# Patient Record
Sex: Female | Born: 1995 | Race: White | Hispanic: No | Marital: Single | Smoking: Never smoker
Health system: Southern US, Community
[De-identification: ages and names within clinical notes are randomized; demographics above are authoritative.]

## PROBLEM LIST (undated history)

## (undated) ENCOUNTER — Inpatient Hospital Stay (HOSPITAL_COMMUNITY): Payer: Self-pay

## (undated) DIAGNOSIS — B999 Unspecified infectious disease: Secondary | ICD-10-CM

## (undated) DIAGNOSIS — Z789 Other specified health status: Secondary | ICD-10-CM

## (undated) HISTORY — PX: TONSILLECTOMY: SUR1361

## (undated) HISTORY — DX: Other specified health status: Z78.9

---
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
## 2013-09-08 ENCOUNTER — Other Ambulatory Visit: Payer: Self-pay | Admitting: Obstetrics and Gynecology

## 2013-09-08 DIAGNOSIS — O3680X0 Pregnancy with inconclusive fetal viability, not applicable or unspecified: Secondary | ICD-10-CM

## 2013-09-10 ENCOUNTER — Other Ambulatory Visit: Payer: Self-pay | Admitting: Obstetrics and Gynecology

## 2013-09-10 ENCOUNTER — Encounter: Payer: Self-pay | Admitting: Obstetrics and Gynecology

## 2013-09-10 ENCOUNTER — Ambulatory Visit (INDEPENDENT_AMBULATORY_CARE_PROVIDER_SITE_OTHER): Payer: Medicaid Other

## 2013-09-10 DIAGNOSIS — O26849 Uterine size-date discrepancy, unspecified trimester: Secondary | ICD-10-CM

## 2013-09-10 DIAGNOSIS — O3680X0 Pregnancy with inconclusive fetal viability, not applicable or unspecified: Secondary | ICD-10-CM

## 2013-09-10 NOTE — Progress Notes (Signed)
U/S-single IUP with +FCA noted, FHR-168 bpm, cx appears closed (3.1cm), Rt ovary with C.L. Simple cyst noted=3.7cm, no free fluid noted within the pelvis, Lt ovary appears WNL, CRL c/w 9+5 wks EDD 04/10/2014

## 2013-09-15 ENCOUNTER — Encounter: Payer: Self-pay | Admitting: Advanced Practice Midwife

## 2013-09-15 ENCOUNTER — Ambulatory Visit (INDEPENDENT_AMBULATORY_CARE_PROVIDER_SITE_OTHER): Payer: Medicaid Other | Admitting: Advanced Practice Midwife

## 2013-09-15 VITALS — BP 122/68 | Ht 64.0 in | Wt 125.0 lb

## 2013-09-15 DIAGNOSIS — Z0184 Encounter for antibody response examination: Secondary | ICD-10-CM

## 2013-09-15 DIAGNOSIS — Z331 Pregnant state, incidental: Secondary | ICD-10-CM

## 2013-09-15 DIAGNOSIS — Z0189 Encounter for other specified special examinations: Secondary | ICD-10-CM

## 2013-09-15 DIAGNOSIS — Z1371 Encounter for nonprocreative screening for genetic disease carrier status: Secondary | ICD-10-CM

## 2013-09-15 DIAGNOSIS — Z3400 Encounter for supervision of normal first pregnancy, unspecified trimester: Secondary | ICD-10-CM | POA: Insufficient documentation

## 2013-09-15 DIAGNOSIS — Z1389 Encounter for screening for other disorder: Secondary | ICD-10-CM

## 2013-09-15 DIAGNOSIS — Z3401 Encounter for supervision of normal first pregnancy, first trimester: Secondary | ICD-10-CM

## 2013-09-15 DIAGNOSIS — Z1159 Encounter for screening for other viral diseases: Secondary | ICD-10-CM

## 2013-09-15 LAB — CBC
HEMATOCRIT: 38.1 % (ref 36.0–46.0)
Hemoglobin: 13 g/dL (ref 12.0–15.0)
MCH: 29.3 pg (ref 26.0–34.0)
MCHC: 34.1 g/dL (ref 30.0–36.0)
MCV: 86 fL (ref 78.0–100.0)
PLATELETS: 240 10*3/uL (ref 150–400)
RBC: 4.43 MIL/uL (ref 3.87–5.11)
RDW: 12.9 % (ref 11.5–15.5)
WBC: 8.4 10*3/uL (ref 4.0–10.5)

## 2013-09-15 LAB — OB RESULTS CONSOLE GC/CHLAMYDIA
CHLAMYDIA, DNA PROBE: NEGATIVE
Gonorrhea: NEGATIVE

## 2013-09-15 MED ORDER — DOXYLAMINE-PYRIDOXINE 10-10 MG PO TBEC: DELAYED_RELEASE_TABLET | ORAL | Status: DC

## 2013-09-15 NOTE — Progress Notes (Signed)
  Subjective:    Debbie Liu is a G1P0 5973w3d being seen today for her first obstetrical visit.  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  Pregnancy history fully reviewed.  Patient reports nausea in the mornings.  Requests medication.  Filed Vitals:   09/15/13 1500 09/15/13 1501  BP: 122/68   Height:  5\' 4"  (1.626 m)  Weight: 125 lb (56.7 kg)     HISTORY: OB History  Gravida Para Term Preterm AB SAB TAB Ectopic Multiple Living  1             # Outcome Date GA Lbr Len/2nd Weight Sex Delivery Anes PTL Lv  1 CUR              Past Medical History  Diagnosis Date  . Medical history non-contributory    Past Surgical History  Procedure Laterality Date  . Tonsillectomy     Family History  Problem Relation Age of Onset  . Thyroid disease Mother   . Cancer Paternal Grandmother     breast  . Diabetes Paternal Grandfather      Exam                                      System:     Skin: normal coloration and turgor, no rashes    Neurologic: oriented, normal, normal mood   Extremities: normal strength, tone, and muscle mass   HEENT PERRLA   Mouth/Teeth mucous membranes moist, normal dentition   Neck supple and no masses   Cardiovascular: regular rate and rhythm   Respiratory:  appears well, vitals normal, no respiratory distress, acyanotic   Abdomen: soft, non-tender;  FHR: 160          Assessment:    Pregnancy: G1P0 Patient Active Problem List   Diagnosis Date Noted  . Encounter for supervision of normal first pregnancy in first trimester 09/15/2013        Plan:     Initial labs drawn. Continue prenatal vitamins  Problem list reviewed and updated  Reviewed n/v relief measures and warning s/s to report .  Diclegis asamples/coupon given Reviewed recommended weight gain based on pre-gravid BMI  Encouraged well-balanced diet Genetic Screening discussed Integrated Screen: requested.  Ultrasound discussed; fetal  survey: requested.  Follow up in 2 weeks for NT/IT/Low-risk ob appt .  CRESENZO-DISHMAN,Rachele Lamaster 09/15/2013

## 2013-09-15 NOTE — Patient Instructions (Signed)
First Trimester of Pregnancy The first trimester of pregnancy is from week 1 until the end of week 12 (months 1 through 3). A week after a sperm fertilizes an egg, the egg will implant on the wall of the uterus. This embryo will begin to develop into a baby. Genes from you and your partner are forming the baby. The female genes determine whether the baby is a boy or a girl. At 6-8 weeks, the eyes and face are formed, and the heartbeat can be seen on ultrasound. At the end of 12 weeks, all the baby's organs are formed.  Now that you are pregnant, you will want to do everything you can to have a healthy baby. Two of the most important things are to get good prenatal care and to follow your health care provider's instructions. Prenatal care is all the medical care you receive before the baby's birth. This care will help prevent, find, and treat any problems during the pregnancy and childbirth. BODY CHANGES Your body goes through many changes during pregnancy. The changes vary from woman to woman.   You may gain or lose a couple of pounds at first.  You may feel sick to your stomach (nauseous) and throw up (vomit). If the vomiting is uncontrollable, call your health care provider.  You may tire easily.  You may develop headaches that can be relieved by medicines approved by your health care provider.  You may urinate more often. Painful urination may mean you have a bladder infection.  You may develop heartburn as a result of your pregnancy.  You may develop constipation because certain hormones are causing the muscles that push waste through your intestines to slow down.  You may develop hemorrhoids or swollen, bulging veins (varicose veins).  Your breasts may begin to grow larger and become tender. Your nipples may stick out more, and the tissue that surrounds them (areola) may become darker.  Your gums may bleed and may be sensitive to brushing and flossing.  Dark spots or blotches (chloasma,  mask of pregnancy) may develop on your face. This will likely fade after the baby is born.  Your menstrual periods will stop.  You may have a loss of appetite.  You may develop cravings for certain kinds of food.  You may have changes in your emotions from day to day, such as being excited to be pregnant or being concerned that something may go wrong with the pregnancy and baby.  You may have more vivid and strange dreams.  You may have changes in your hair. These can include thickening of your hair, rapid growth, and changes in texture. Some women also have hair loss during or after pregnancy, or hair that feels dry or thin. Your hair will most likely return to normal after your baby is born. WHAT TO EXPECT AT YOUR PRENATAL VISITS During a routine prenatal visit:  You will be weighed to make sure you and the baby are growing normally.  Your blood pressure will be taken.  Your abdomen will be measured to track your baby's growth.  The fetal heartbeat will be listened to starting around week 10 or 12 of your pregnancy.  Test results from any previous visits will be discussed. Your health care provider may ask you:  How you are feeling.  If you are feeling the baby move.  If you have had any abnormal symptoms, such as leaking fluid, bleeding, severe headaches, or abdominal cramping.  If you have any questions. Other tests   that may be performed during your first trimester include:  Blood tests to find your blood type and to check for the presence of any previous infections. They will also be used to check for low iron levels (anemia) and Rh antibodies. Later in the pregnancy, blood tests for diabetes will be done along with other tests if problems develop.  Urine tests to check for infections, diabetes, or protein in the urine.  An ultrasound to confirm the proper growth and development of the baby.  An amniocentesis to check for possible genetic problems.  Fetal screens for  spina bifida and Down syndrome.  You may need other tests to make sure you and the baby are doing well. HOME CARE INSTRUCTIONS  Medicines  Follow your health care provider's instructions regarding medicine use. Specific medicines may be either safe or unsafe to take during pregnancy.  Take your prenatal vitamins as directed.  If you develop constipation, try taking a stool softener if your health care provider approves. Diet  Eat regular, well-balanced meals. Choose a variety of foods, such as meat or vegetable-based protein, fish, milk and low-fat dairy products, vegetables, fruits, and whole grain breads and cereals. Your health care provider will help you determine the amount of weight gain that is right for you.  Avoid raw meat and uncooked cheese. These carry germs that can cause birth defects in the baby.  Eating four or five small meals rather than three large meals a day may help relieve nausea and vomiting. If you start to feel nauseous, eating a few soda crackers can be helpful. Drinking liquids between meals instead of during meals also seems to help nausea and vomiting.  If you develop constipation, eat more high-fiber foods, such as fresh vegetables or fruit and whole grains. Drink enough fluids to keep your urine clear or pale yellow. Activity and Exercise  Exercise only as directed by your health care provider. Exercising will help you:  Control your weight.  Stay in shape.  Be prepared for labor and delivery.  Experiencing pain or cramping in the lower abdomen or low back is a good sign that you should stop exercising. Check with your health care provider before continuing normal exercises.  Try to avoid standing for long periods of time. Move your legs often if you must stand in one place for a long time.  Avoid heavy lifting.  Wear low-heeled shoes, and practice good posture.  You may continue to have sex unless your health care provider directs you  otherwise. Relief of Pain or Discomfort  Wear a good support bra for breast tenderness.   Take warm sitz baths to soothe any pain or discomfort caused by hemorrhoids. Use hemorrhoid cream if your health care provider approves.   Rest with your legs elevated if you have leg cramps or low back pain.  If you develop varicose veins in your legs, wear support hose. Elevate your feet for 15 minutes, 3-4 times a day. Limit salt in your diet. Prenatal Care  Schedule your prenatal visits by the twelfth week of pregnancy. They are usually scheduled monthly at first, then more often in the last 2 months before delivery.  Write down your questions. Take them to your prenatal visits.  Keep all your prenatal visits as directed by your health care provider. Safety  Wear your seat belt at all times when driving.  Make a list of emergency phone numbers, including numbers for family, friends, the hospital, and police and fire departments. General Tips    Ask your health care provider for a referral to a local prenatal education class. Begin classes no later than at the beginning of month 6 of your pregnancy.  Ask for help if you have counseling or nutritional needs during pregnancy. Your health care provider can offer advice or refer you to specialists for help with various needs.  Do not use hot tubs, steam rooms, or saunas.  Do not douche or use tampons or scented sanitary pads.  Do not cross your legs for long periods of time.  Avoid cat litter boxes and soil used by cats. These carry germs that can cause birth defects in the baby and possibly loss of the fetus by miscarriage or stillbirth.  Avoid all smoking, herbs, alcohol, and medicines not prescribed by your health care provider. Chemicals in these affect the formation and growth of the baby.  Schedule a dentist appointment. At home, brush your teeth with a soft toothbrush and be gentle when you floss. SEEK MEDICAL CARE IF:   You have  dizziness.  You have mild pelvic cramps, pelvic pressure, or nagging pain in the abdominal area.  You have persistent nausea, vomiting, or diarrhea.  You have a bad smelling vaginal discharge.  You have pain with urination.  You notice increased swelling in your face, hands, legs, or ankles. SEEK IMMEDIATE MEDICAL CARE IF:   You have a fever.  You are leaking fluid from your vagina.  You have spotting or bleeding from your vagina.  You have severe abdominal cramping or pain.  You have rapid weight gain or loss.  You vomit blood or material that looks like coffee grounds.  You are exposed to German measles and have never had them.  You are exposed to fifth disease or chickenpox.  You develop a severe headache.  You have shortness of breath.  You have any kind of trauma, such as from a fall or a car accident. Document Released: 01/23/2001 Document Revised: 06/15/2013 Document Reviewed: 12/09/2012 Exit[employer] Patient Information 2015 [employer]. This information is not intended to replace advice given to you by your health care provider. Make sure you discuss any questions you have with your health care provider.  

## 2013-09-16 LAB — URINALYSIS, ROUTINE W REFLEX MICROSCOPIC
Bilirubin Urine: NEGATIVE
Glucose, UA: NEGATIVE mg/dL
Hgb urine dipstick: NEGATIVE
Ketones, ur: NEGATIVE mg/dL
Leukocytes, UA: NEGATIVE
NITRITE: NEGATIVE
Protein, ur: NEGATIVE mg/dL
SPECIFIC GRAVITY, URINE: 1.029 (ref 1.005–1.030)
UROBILINOGEN UA: 0.2 mg/dL (ref 0.0–1.0)
pH: 6 (ref 5.0–8.0)

## 2013-09-16 LAB — DRUG SCREEN, URINE, NO CONFIRMATION
Amphetamine Screen, Ur: NEGATIVE
BENZODIAZEPINES.: NEGATIVE
Barbiturate Quant, Ur: NEGATIVE
Cocaine Metabolites: NEGATIVE
Creatinine,U: 167.3 mg/dL
MARIJUANA METABOLITE: NEGATIVE
Methadone: NEGATIVE
Opiate Screen, Urine: NEGATIVE
Phencyclidine (PCP): NEGATIVE
Propoxyphene: NEGATIVE

## 2013-09-16 LAB — ANTIBODY SCREEN: ANTIBODY SCREEN: NEGATIVE

## 2013-09-16 LAB — HIV ANTIBODY (ROUTINE TESTING W REFLEX): HIV 1&2 Ab, 4th Generation: NONREACTIVE

## 2013-09-16 LAB — GC/CHLAMYDIA PROBE AMP
CT Probe RNA: NEGATIVE
GC Probe RNA: NEGATIVE

## 2013-09-16 LAB — VARICELLA ZOSTER ANTIBODY, IGG: VARICELLA IGG: 87.14 {index} (ref <135.00)

## 2013-09-16 LAB — OXYCODONE SCREEN, UA, RFLX CONFIRM: OXYCODONE SCRN UR: NEGATIVE ng/mL

## 2013-09-16 LAB — HEPATITIS B SURFACE ANTIGEN: HEP B S AG: NEGATIVE

## 2013-09-16 LAB — RPR

## 2013-09-16 LAB — RUBELLA SCREEN: RUBELLA: 1 {index} — AB (ref <0.90)

## 2013-09-16 LAB — ABO AND RH: Rh Type: POSITIVE

## 2013-09-17 LAB — URINE CULTURE
Colony Count: NO GROWTH
Organism ID, Bacteria: NO GROWTH

## 2013-09-18 LAB — CYSTIC FIBROSIS DIAGNOSTIC STUDY

## 2013-09-23 ENCOUNTER — Ambulatory Visit (INDEPENDENT_AMBULATORY_CARE_PROVIDER_SITE_OTHER): Payer: Medicaid Other

## 2013-09-23 ENCOUNTER — Other Ambulatory Visit: Payer: Self-pay | Admitting: Advanced Practice Midwife

## 2013-09-23 ENCOUNTER — Encounter: Payer: Self-pay | Admitting: Women's Health

## 2013-09-23 ENCOUNTER — Ambulatory Visit (INDEPENDENT_AMBULATORY_CARE_PROVIDER_SITE_OTHER): Payer: Medicaid Other | Admitting: Women's Health

## 2013-09-23 VITALS — BP 102/70 | Wt 127.0 lb

## 2013-09-23 DIAGNOSIS — Z1389 Encounter for screening for other disorder: Secondary | ICD-10-CM

## 2013-09-23 DIAGNOSIS — Z3401 Encounter for supervision of normal first pregnancy, first trimester: Secondary | ICD-10-CM

## 2013-09-23 DIAGNOSIS — Z283 Underimmunization status: Secondary | ICD-10-CM

## 2013-09-23 DIAGNOSIS — Z36 Encounter for antenatal screening of mother: Secondary | ICD-10-CM

## 2013-09-23 DIAGNOSIS — Z2839 Other underimmunization status: Secondary | ICD-10-CM | POA: Insufficient documentation

## 2013-09-23 DIAGNOSIS — Z331 Pregnant state, incidental: Secondary | ICD-10-CM

## 2013-09-23 DIAGNOSIS — Z3400 Encounter for supervision of normal first pregnancy, unspecified trimester: Secondary | ICD-10-CM

## 2013-09-23 DIAGNOSIS — O09899 Supervision of other high risk pregnancies, unspecified trimester: Secondary | ICD-10-CM | POA: Insufficient documentation

## 2013-09-23 LAB — POCT URINALYSIS DIPSTICK
Blood, UA: NEGATIVE
GLUCOSE UA: NEGATIVE
Ketones, UA: NEGATIVE
NITRITE UA: NEGATIVE

## 2013-09-23 NOTE — Progress Notes (Signed)
U/S(11+4wks)-single active fetus, anterior Gr 0 placenta,cx appears closed (3.4cm), bilateral adnexa appears WNL with C.L. Noted on Rt=2.9cm simple, FHR- 162 bpm, CRL c/w dates,NB present,NT-1.816mm

## 2013-09-23 NOTE — Patient Instructions (Signed)
First Trimester of Pregnancy The first trimester of pregnancy is from week 1 until the end of week 12 (months 1 through 3). A week after a sperm fertilizes an egg, the egg will implant on the wall of the uterus. This embryo will begin to develop into a baby. Genes from you and your partner are forming the baby. The female genes determine whether the baby is a boy or a girl. At 6-8 weeks, the eyes and face are formed, and the heartbeat can be seen on ultrasound. At the end of 12 weeks, all the baby's organs are formed.  Now that you are pregnant, you will want to do everything you can to have a healthy baby. Two of the most important things are to get good prenatal care and to follow your health care provider's instructions. Prenatal care is all the medical care you receive before the baby's birth. This care will help prevent, find, and treat any problems during the pregnancy and childbirth. BODY CHANGES Your body goes through many changes during pregnancy. The changes vary from woman to woman.   You may gain or lose a couple of pounds at first.  You may feel sick to your stomach (nauseous) and throw up (vomit). If the vomiting is uncontrollable, call your health care provider.  You may tire easily.  You may develop headaches that can be relieved by medicines approved by your health care provider.  You may urinate more often. Painful urination may mean you have a bladder infection.  You may develop heartburn as a result of your pregnancy.  You may develop constipation because certain hormones are causing the muscles that push waste through your intestines to slow down.  You may develop hemorrhoids or swollen, bulging veins (varicose veins).  Your breasts may begin to grow larger and become tender. Your nipples may stick out more, and the tissue that surrounds them (areola) may become darker.  Your gums may bleed and may be sensitive to brushing and flossing.  Dark spots or blotches (chloasma,  mask of pregnancy) may develop on your face. This will likely fade after the baby is born.  Your menstrual periods will stop.  You may have a loss of appetite.  You may develop cravings for certain kinds of food.  You may have changes in your emotions from day to day, such as being excited to be pregnant or being concerned that something may go wrong with the pregnancy and baby.  You may have more vivid and strange dreams.  You may have changes in your hair. These can include thickening of your hair, rapid growth, and changes in texture. Some women also have hair loss during or after pregnancy, or hair that feels dry or thin. Your hair will most likely return to normal after your baby is born. WHAT TO EXPECT AT YOUR PRENATAL VISITS During a routine prenatal visit:  You will be weighed to make sure you and the baby are growing normally.  Your blood pressure will be taken.  Your abdomen will be measured to track your baby's growth.  The fetal heartbeat will be listened to starting around week 10 or 12 of your pregnancy.  Test results from any previous visits will be discussed. Your health care provider may ask you:  How you are feeling.  If you are feeling the baby move.  If you have had any abnormal symptoms, such as leaking fluid, bleeding, severe headaches, or abdominal cramping.  If you have any questions. Other tests   that may be performed during your first trimester include:  Blood tests to find your blood type and to check for the presence of any previous infections. They will also be used to check for low iron levels (anemia) and Rh antibodies. Later in the pregnancy, blood tests for diabetes will be done along with other tests if problems develop.  Urine tests to check for infections, diabetes, or protein in the urine.  An ultrasound to confirm the proper growth and development of the baby.  An amniocentesis to check for possible genetic problems.  Fetal screens for  spina bifida and Down syndrome.  You may need other tests to make sure you and the baby are doing well. HOME CARE INSTRUCTIONS  Medicines  Follow your health care provider's instructions regarding medicine use. Specific medicines may be either safe or unsafe to take during pregnancy.  Take your prenatal vitamins as directed.  If you develop constipation, try taking a stool softener if your health care provider approves. Diet  Eat regular, well-balanced meals. Choose a variety of foods, such as meat or vegetable-based protein, fish, milk and low-fat dairy products, vegetables, fruits, and whole grain breads and cereals. Your health care provider will help you determine the amount of weight gain that is right for you.  Avoid raw meat and uncooked cheese. These carry germs that can cause birth defects in the baby.  Eating four or five small meals rather than three large meals a day may help relieve nausea and vomiting. If you start to feel nauseous, eating a few soda crackers can be helpful. Drinking liquids between meals instead of during meals also seems to help nausea and vomiting.  If you develop constipation, eat more high-fiber foods, such as fresh vegetables or fruit and whole grains. Drink enough fluids to keep your urine clear or pale yellow. Activity and Exercise  Exercise only as directed by your health care provider. Exercising will help you:  Control your weight.  Stay in shape.  Be prepared for labor and delivery.  Experiencing pain or cramping in the lower abdomen or low back is a good sign that you should stop exercising. Check with your health care provider before continuing normal exercises.  Try to avoid standing for long periods of time. Move your legs often if you must stand in one place for a long time.  Avoid heavy lifting.  Wear low-heeled shoes, and practice good posture.  You may continue to have sex unless your health care provider directs you  otherwise. Relief of Pain or Discomfort  Wear a good support bra for breast tenderness.   Take warm sitz baths to soothe any pain or discomfort caused by hemorrhoids. Use hemorrhoid cream if your health care provider approves.   Rest with your legs elevated if you have leg cramps or low back pain.  If you develop varicose veins in your legs, wear support hose. Elevate your feet for 15 minutes, 3-4 times a day. Limit salt in your diet. Prenatal Care  Schedule your prenatal visits by the twelfth week of pregnancy. They are usually scheduled monthly at first, then more often in the last 2 months before delivery.  Write down your questions. Take them to your prenatal visits.  Keep all your prenatal visits as directed by your health care provider. Safety  Wear your seat belt at all times when driving.  Make a list of emergency phone numbers, including numbers for family, friends, the hospital, and police and fire departments. General Tips    Ask your health care provider for a referral to a local prenatal education class. Begin classes no later than at the beginning of month 6 of your pregnancy.  Ask for help if you have counseling or nutritional needs during pregnancy. Your health care provider can offer advice or refer you to specialists for help with various needs.  Do not use hot tubs, steam rooms, or saunas.  Do not douche or use tampons or scented sanitary pads.  Do not cross your legs for long periods of time.  Avoid cat litter boxes and soil used by cats. These carry germs that can cause birth defects in the baby and possibly loss of the fetus by miscarriage or stillbirth.  Avoid all smoking, herbs, alcohol, and medicines not prescribed by your health care provider. Chemicals in these affect the formation and growth of the baby.  Schedule a dentist appointment. At home, brush your teeth with a soft toothbrush and be gentle when you floss. SEEK MEDICAL CARE IF:   You have  dizziness.  You have mild pelvic cramps, pelvic pressure, or nagging pain in the abdominal area.  You have persistent nausea, vomiting, or diarrhea.  You have a bad smelling vaginal discharge.  You have pain with urination.  You notice increased swelling in your face, hands, legs, or ankles. SEEK IMMEDIATE MEDICAL CARE IF:   You have a fever.  You are leaking fluid from your vagina.  You have spotting or bleeding from your vagina.  You have severe abdominal cramping or pain.  You have rapid weight gain or loss.  You vomit blood or material that looks like coffee grounds.  You are exposed to German measles and have never had them.  You are exposed to fifth disease or chickenpox.  You develop a severe headache.  You have shortness of breath.  You have any kind of trauma, such as from a fall or a car accident. Document Released: 01/23/2001 Document Revised: 06/15/2013 Document Reviewed: 12/09/2012 Exit[employer] Patient Information 2015 [employer]. This information is not intended to replace advice given to you by your health care provider. Make sure you discuss any questions you have with your health care provider.  

## 2013-09-23 NOTE — Progress Notes (Signed)
Low-risk OB appointment G1P0 5375w4d Estimated Date of Delivery: 04/10/14 BP 102/70  Wt 127 lb (57.607 kg)  LMP 07/20/2013  BP, weight, and urine reviewed.  Refer to obstetrical flow sheet for FH & FHR.  Denies cramping, lof, vb, or uti s/s. No complaints. Reviewed today's normal NT u/s, warning s/s to report. Plan:  Continue routine obstetrical care  F/U in 4wks for OB appointment and 2nd IT 1st nt/it today

## 2013-09-28 LAB — MATERNAL SCREEN, INTEGRATED #1

## 2013-10-21 ENCOUNTER — Ambulatory Visit (INDEPENDENT_AMBULATORY_CARE_PROVIDER_SITE_OTHER): Payer: Medicaid Other | Admitting: Women's Health

## 2013-10-21 VITALS — BP 108/60 | Wt 130.0 lb

## 2013-10-21 DIAGNOSIS — Z331 Pregnant state, incidental: Secondary | ICD-10-CM

## 2013-10-21 DIAGNOSIS — Z3401 Encounter for supervision of normal first pregnancy, first trimester: Secondary | ICD-10-CM

## 2013-10-21 DIAGNOSIS — Z1389 Encounter for screening for other disorder: Secondary | ICD-10-CM

## 2013-10-21 DIAGNOSIS — Z3400 Encounter for supervision of normal first pregnancy, unspecified trimester: Secondary | ICD-10-CM

## 2013-10-21 DIAGNOSIS — Z36 Encounter for antenatal screening of mother: Secondary | ICD-10-CM

## 2013-10-21 LAB — POCT URINALYSIS DIPSTICK
Blood, UA: NEGATIVE
Glucose, UA: NEGATIVE
Ketones, UA: NEGATIVE
LEUKOCYTES UA: NEGATIVE
Nitrite, UA: NEGATIVE
PROTEIN UA: NEGATIVE

## 2013-10-21 NOTE — Progress Notes (Signed)
Low-risk OB appointment G1P0 [redacted]w[redacted]d Estimated Date of Delivery: 04/10/14 LMP 07/20/2013  BP, weight, and urine reviewed.  Refer to obstetrical flow sheet for FH & FHR.  No fm yet. Denies cramping, lof, vb, or uti s/s. No complaints. Interested in Systems developer, info given Reviewed warning s/s to report. Plan:  Continue routine obstetrical care  F/U in 4wks for OB appointment and anatomy u/s 2nd IT today

## 2013-10-21 NOTE — Patient Instructions (Signed)
Second Trimester of Pregnancy The second trimester is from week 13 through week 28, months 4 through 6. The second trimester is often a time when you feel your best. Your body has also adjusted to being pregnant, and you begin to feel better physically. Usually, morning sickness has lessened or quit completely, you may have more energy, and you may have an increase in appetite. The second trimester is also a time when the fetus is growing rapidly. At the end of the sixth month, the fetus is about 9 inches long and weighs about 1 pounds. You will likely begin to feel the baby move (quickening) between 18 and 20 weeks of the pregnancy. BODY CHANGES Your body goes through many changes during pregnancy. The changes vary from woman to woman.   Your weight will continue to increase. You will notice your lower abdomen bulging out.  You may begin to get stretch marks on your hips, abdomen, and breasts.  You may develop headaches that can be relieved by medicines approved by your health care provider.  You may urinate more often because the fetus is pressing on your bladder.  You may develop or continue to have heartburn as a result of your pregnancy.  You may develop constipation because certain hormones are causing the muscles that push waste through your intestines to slow down.  You may develop hemorrhoids or swollen, bulging veins (varicose veins).  You may have back pain because of the weight gain and pregnancy hormones relaxing your joints between the bones in your pelvis and as a result of a shift in weight and the muscles that support your balance.  Your breasts will continue to grow and be tender.  Your gums may bleed and may be sensitive to brushing and flossing.  Dark spots or blotches (chloasma, mask of pregnancy) may develop on your face. This will likely fade after the baby is born.  A dark line from your belly button to the pubic area (linea nigra) may appear. This will likely fade  after the baby is born.  You may have changes in your hair. These can include thickening of your hair, rapid growth, and changes in texture. Some women also have hair loss during or after pregnancy, or hair that feels dry or thin. Your hair will most likely return to normal after your baby is born. WHAT TO EXPECT AT YOUR PRENATAL VISITS During a routine prenatal visit:  You will be weighed to make sure you and the fetus are growing normally.  Your blood pressure will be taken.  Your abdomen will be measured to track your baby's growth.  The fetal heartbeat will be listened to.  Any test results from the previous visit will be discussed. Your health care provider may ask you:  How you are feeling.  If you are feeling the baby move.  If you have had any abnormal symptoms, such as leaking fluid, bleeding, severe headaches, or abdominal cramping.  If you have any questions. Other tests that may be performed during your second trimester include:  Blood tests that check for:  Low iron levels (anemia).  Gestational diabetes (between 24 and 28 weeks).  Rh antibodies.  Urine tests to check for infections, diabetes, or protein in the urine.  An ultrasound to confirm the proper growth and development of the baby.  An amniocentesis to check for possible genetic problems.  Fetal screens for spina bifida and Down syndrome. HOME CARE INSTRUCTIONS   Avoid all smoking, herbs, alcohol, and unprescribed   drugs. These chemicals affect the formation and growth of the baby.  Follow your health care provider's instructions regarding medicine use. There are medicines that are either safe or unsafe to take during pregnancy.  Exercise only as directed by your health care provider. Experiencing uterine cramps is a good sign to stop exercising.  Continue to eat regular, healthy meals.  Wear a good support bra for breast tenderness.  Do not use hot tubs, steam rooms, or saunas.  Wear your  seat belt at all times when driving.  Avoid raw meat, uncooked cheese, cat litter boxes, and soil used by cats. These carry germs that can cause birth defects in the baby.  Take your prenatal vitamins.  Try taking a stool softener (if your health care provider approves) if you develop constipation. Eat more high-fiber foods, such as fresh vegetables or fruit and whole grains. Drink plenty of fluids to keep your urine clear or pale yellow.  Take warm sitz baths to soothe any pain or discomfort caused by hemorrhoids. Use hemorrhoid cream if your health care provider approves.  If you develop varicose veins, wear support hose. Elevate your feet for 15 minutes, 3-4 times a day. Limit salt in your diet.  Avoid heavy lifting, wear low heel shoes, and practice good posture.  Rest with your legs elevated if you have leg cramps or low back pain.  Visit your dentist if you have not gone yet during your pregnancy. Use a soft toothbrush to brush your teeth and be gentle when you floss.  A sexual relationship may be continued unless your health care provider directs you otherwise.  Continue to go to all your prenatal visits as directed by your health care provider. SEEK MEDICAL CARE IF:   You have dizziness.  You have mild pelvic cramps, pelvic pressure, or nagging pain in the abdominal area.  You have persistent nausea, vomiting, or diarrhea.  You have a bad smelling vaginal discharge.  You have pain with urination. SEEK IMMEDIATE MEDICAL CARE IF:   You have a fever.  You are leaking fluid from your vagina.  You have spotting or bleeding from your vagina.  You have severe abdominal cramping or pain.  You have rapid weight gain or loss.  You have shortness of breath with chest pain.  You notice sudden or extreme swelling of your face, hands, ankles, feet, or legs.  You have not felt your baby move in over an hour.  You have severe headaches that do not go away with  medicine.  You have vision changes. Document Released: 01/23/2001 Document Revised: 02/03/2013 Document Reviewed: 04/01/2012 ExitCare Patient Information 2015 [employer]. This information is not intended to replace advice given to you by your health care provider. Make sure you discuss any questions you have with your health care provider.  

## 2013-10-21 NOTE — Addendum Note (Signed)
Addended by: Colen Darling on: 10/21/2013 04:48 PM   Modules accepted: Orders

## 2013-10-24 LAB — MATERNAL SCREEN, INTEGRATED #2
AFP MoM: 1.24
AFP, Serum: 39.6 ng/mL
Age risk Down Syndrome: 1:1100 {titer}
Calculated Gestational Age: 15.1
Crown Rump Length: 46.1 mm
Estriol Mom: 1.19
Estriol, Free: 0.81 ng/mL
Inhibin A Dimeric: 229 pg/mL
Inhibin A MoM: 1.18
MSS Down Syndrome: <1:5000 {titer}
MSS Trisomy 18 Risk: <1:5000 {titer}
NT MoM: 1.36
Nuchal Translucency: 1.6 mm
Number of fetuses: 1
PAPP-A MoM: 1.1
PAPP-A: 571 ng/mL
Rish for ONTD: 1:3600 {titer}
hCG MoM: 1.11
hCG, Serum: 52.2 [IU]/mL

## 2013-10-26 ENCOUNTER — Encounter: Payer: Self-pay | Admitting: Women's Health

## 2013-11-06 ENCOUNTER — Encounter (HOSPITAL_COMMUNITY): Payer: Self-pay | Admitting: *Deleted

## 2013-11-06 ENCOUNTER — Inpatient Hospital Stay (HOSPITAL_COMMUNITY)
Admission: AD | Admit: 2013-11-06 | Discharge: 2013-11-06 | Disposition: A | Discharge status: Home / Self Care | Payer: Medicaid Other | Source: Ambulatory Visit | Attending: Obstetrics & Gynecology | Admitting: Obstetrics & Gynecology

## 2013-11-06 DIAGNOSIS — R21 Rash and other nonspecific skin eruption: Secondary | ICD-10-CM | POA: Diagnosis present

## 2013-11-06 DIAGNOSIS — O99891 Other specified diseases and conditions complicating pregnancy: Secondary | ICD-10-CM | POA: Diagnosis not present

## 2013-11-06 DIAGNOSIS — L508 Other urticaria: Secondary | ICD-10-CM | POA: Insufficient documentation

## 2013-11-06 DIAGNOSIS — Z3492 Encounter for supervision of normal pregnancy, unspecified, second trimester: Secondary | ICD-10-CM

## 2013-11-06 DIAGNOSIS — O9989 Other specified diseases and conditions complicating pregnancy, childbirth and the puerperium: Principal | ICD-10-CM

## 2013-11-06 DIAGNOSIS — L509 Urticaria, unspecified: Secondary | ICD-10-CM

## 2013-11-06 MED ORDER — METHYLPREDNISOLONE (PAK) 4 MG PO TABS: ORAL_TABLET | ORAL | Status: DC

## 2013-11-06 NOTE — MAU Note (Addendum)
Rash allover body, itches, when scratches it turns into whelps. Calamine calms it down. No oozing or drainage.  Hands and feet are swelling.  Significant other had it first, but not as bad

## 2013-11-06 NOTE — MAU Provider Note (Signed)
  History     CSN: [id number]  Arrival date and time: 11/06/13 4098   First Provider Initiated Contact with Patient 11/06/13 1858      Chief Complaint  Patient presents with  . Rash   HPI Debbie Liu is 18 y.o. G1P0 [redacted]w[redacted]d weeks presenting with a rash that began yesterday. She is a patient of [facility].  Woke up yesterday am with swollen eyes when she woke up, had resolved by 9am.  Saw "bug bites" yesterday afternoon.  She reports they are very itchy and when she scratches it swells.  Rash is red.  Neg for draining lesions.  Boyfriend had same rash and sxs 5 days ago.  His daughter is also in the house and doesn't have rash.  They washed all linen.  Denies fever, chills, URI sxs   She denies changing detergents, wearing clothes not washed first, no new foods or medicine, exposure to poison ivy,oak or sumac. Has used calamine without relief.    Past Medical History  Diagnosis Date  . Medical history non-contributory     Past Surgical History  Procedure Laterality Date  . Tonsillectomy      Family History  Problem Relation Age of Onset  . Thyroid disease Mother   . Cancer Paternal Grandmother     breast  . Diabetes Paternal Grandfather     History  Substance Use Topics  . Smoking status: Never Smoker   . Smokeless tobacco: Never Used  . Alcohol Use: No     Comment: proir to pregnancy    Allergies: No Known Allergies  Prescriptions prior to admission  Medication Sig Dispense Refill  . Prenatal Vit-Fe Fumarate-FA (PRENATAL MULTIVITAMIN) TABS tablet Take 1 tablet by mouth daily at 12 noon.        Review of Systems  Gastrointestinal: Negative for nausea, vomiting and abdominal pain.  Genitourinary: Negative.        Neg for vaginal bleeding  Skin: Positive for itching (intense) and rash (diffuse).   Physical Exam   Blood pressure 120/71, pulse 78, temperature 98.7 F (37.1 C), temperature source Oral, resp. rate 16, last menstrual period 07/20/2013.  Physical Exam   Constitutional: She is oriented to person, place, and time. She appears well-developed and well-nourished.  NAD  Cardiovascular: Normal rate.   Respiratory: No respiratory distress.  Neurological: She is alert and oriented to person, place, and time.  Skin: Rash (diffuse red, raised rash from chin to feet.  Neg for bullous lesions, excoriation.  It is erythematous and raised. ) noted.  Psychiatric: She has a normal mood and affect. Her behavior is normal.   MAU Course  Procedures  MDM Dr. Emelda Fear in to see patient with me.  Order given for Medrol Dose pack or Prednisone  X 5 days, which ever one is available.  Benadryl prn  Assessment and Plan  A:  Urticarial rash      [redacted]w[redacted]d gestation  P:  Rx to pharmacy-begin to night      Call Dr. Emelda Fear for worsening sxs     Keep scheduled appointment        Jhordyn Hoopingarner,EVE M 11/06/2013, 6:58 PM

## 2013-11-06 NOTE — Discharge Instructions (Signed)
Hives  Hives are itchy, red, puffy (swollen) areas of the skin. Hives can change in size and location on your body. Hives can come and go for hours, days, or weeks. Hives do not spread from person to person (noncontagious). Scratching, exercise, and stress can make your hives worse.  HOME CARE  · Avoid things that cause your hives (triggers).  · Take antihistamine medicines as told by your doctor. Do not drive while taking an antihistamine.  · Take any other medicines for itching as told by your doctor.  · Wear loose-fitting clothing.  · Keep all doctor visits as told.  GET HELP RIGHT AWAY IF:   · You have a fever.  · Your tongue or lips are puffy.  · You have trouble breathing or swallowing.  · You feel tightness in the throat or chest.  · You have belly (abdominal) pain.  · You have lasting or severe itching that is not helped by medicine.  · You have painful or puffy joints.  These problems may be the first sign of a life-threatening allergic reaction. Call your local emergency services (911 in [place]  MAKE SURE YOU:   · Understand these instructions.  · Will watch your condition.  · Will get help right away if you are not doing well or get worse.  Document Released: 11/08/2007 Document Revised: 07/31/2011 Document Reviewed: 04/24/2011  ExitCare® Patient Information ©2015 [employer]. This information is not intended to replace advice given to you by your health care provider. Make sure you discuss any questions you have with your health care provider.

## 2013-11-08 NOTE — MAU Provider Note (Signed)
Attestation of Attending Supervision of Advanced Practitioner: Evaluation and management procedures were performed by the PA/NP/CNM/OB Fellow under my supervision/collaboration. Chart reviewed and agree with management and plan. Pt seen with provider  Tilda Burrow 11/08/2013 4:44 PM

## 2013-11-09 ENCOUNTER — Telehealth: Payer: Self-pay | Admitting: Women's Health

## 2013-11-09 NOTE — Telephone Encounter (Signed)
Informed pt of Kim's instructions and pt verbalized understanding.

## 2013-11-09 NOTE — Telephone Encounter (Signed)
Pt states she has been getting hives off and on, it goes away with Benadryl but comes back when Benadryl wears off.  She went to [facility] on Friday and they put her on prednisone, she started it Saturday morning and states it is not helping at all, she is still having to use Benadryl.  Pt states that someone that works at [facility] told her it may be PUPP , she wants to know if she should continue the medication the hospital gave her or come in here to be seen.  Please advise.

## 2013-11-18 ENCOUNTER — Other Ambulatory Visit: Payer: Self-pay

## 2013-11-18 ENCOUNTER — Encounter: Payer: Self-pay | Admitting: Advanced Practice Midwife

## 2013-11-18 ENCOUNTER — Ambulatory Visit: Payer: Self-pay

## 2013-11-25 ENCOUNTER — Ambulatory Visit (INDEPENDENT_AMBULATORY_CARE_PROVIDER_SITE_OTHER): Payer: Medicaid Other

## 2013-11-25 ENCOUNTER — Ambulatory Visit (INDEPENDENT_AMBULATORY_CARE_PROVIDER_SITE_OTHER): Payer: Medicaid Other | Admitting: Women's Health

## 2013-11-25 ENCOUNTER — Other Ambulatory Visit: Payer: Self-pay | Admitting: Women's Health

## 2013-11-25 ENCOUNTER — Other Ambulatory Visit: Payer: Self-pay

## 2013-11-25 VITALS — BP 100/64 | Wt 137.0 lb

## 2013-11-25 DIAGNOSIS — Z3401 Encounter for supervision of normal first pregnancy, first trimester: Secondary | ICD-10-CM

## 2013-11-25 DIAGNOSIS — Z363 Encounter for antenatal screening for malformations: Secondary | ICD-10-CM

## 2013-11-25 DIAGNOSIS — Z331 Pregnant state, incidental: Secondary | ICD-10-CM

## 2013-11-25 DIAGNOSIS — Z36 Encounter for antenatal screening of mother: Secondary | ICD-10-CM

## 2013-11-25 DIAGNOSIS — Z1389 Encounter for screening for other disorder: Secondary | ICD-10-CM

## 2013-11-25 LAB — POCT URINALYSIS DIPSTICK
Blood, UA: NEGATIVE
GLUCOSE UA: NEGATIVE
KETONES UA: NEGATIVE
LEUKOCYTES UA: NEGATIVE
Nitrite, UA: NEGATIVE
Protein, UA: NEGATIVE

## 2013-11-25 NOTE — Progress Notes (Signed)
U/S(20+4wks)- active fetus, meas c/w dates, fluid wnl, anterior Gr 0 placenta, cx appears closed, bilateral adnexa appears WNL FHR-155 bpm, no major abnl noted

## 2013-11-25 NOTE — Patient Instructions (Signed)
Second Trimester of Pregnancy The second trimester is from week 13 through week 28, months 4 through 6. The second trimester is often a time when you feel your best. Your body has also adjusted to being pregnant, and you begin to feel better physically. Usually, morning sickness has lessened or quit completely, you may have more energy, and you may have an increase in appetite. The second trimester is also a time when the fetus is growing rapidly. At the end of the sixth month, the fetus is about 9 inches long and weighs about 1 pounds. You will likely begin to feel the baby move (quickening) between 18 and 20 weeks of the pregnancy. BODY CHANGES Your body goes through many changes during pregnancy. The changes vary from woman to woman.   Your weight will continue to increase. You will notice your lower abdomen bulging out.  You may begin to get stretch marks on your hips, abdomen, and breasts.  You may develop headaches that can be relieved by medicines approved by your health care provider.  You may urinate more often because the fetus is pressing on your bladder.  You may develop or continue to have heartburn as a result of your pregnancy.  You may develop constipation because certain hormones are causing the muscles that push waste through your intestines to slow down.  You may develop hemorrhoids or swollen, bulging veins (varicose veins).  You may have back pain because of the weight gain and pregnancy hormones relaxing your joints between the bones in your pelvis and as a result of a shift in weight and the muscles that support your balance.  Your breasts will continue to grow and be tender.  Your gums may bleed and may be sensitive to brushing and flossing.  Dark spots or blotches (chloasma, mask of pregnancy) may develop on your face. This will likely fade after the baby is born.  A dark line from your belly button to the pubic area (linea nigra) may appear. This will likely fade  after the baby is born.  You may have changes in your hair. These can include thickening of your hair, rapid growth, and changes in texture. Some women also have hair loss during or after pregnancy, or hair that feels dry or thin. Your hair will most likely return to normal after your baby is born. WHAT TO EXPECT AT YOUR PRENATAL VISITS During a routine prenatal visit:  You will be weighed to make sure you and the fetus are growing normally.  Your blood pressure will be taken.  Your abdomen will be measured to track your baby's growth.  The fetal heartbeat will be listened to.  Any test results from the previous visit will be discussed. Your health care provider may ask you:  How you are feeling.  If you are feeling the baby move.  If you have had any abnormal symptoms, such as leaking fluid, bleeding, severe headaches, or abdominal cramping.  If you have any questions. Other tests that may be performed during your second trimester include:  Blood tests that check for:  Low iron levels (anemia).  Gestational diabetes (between 24 and 28 weeks).  Rh antibodies.  Urine tests to check for infections, diabetes, or protein in the urine.  An ultrasound to confirm the proper growth and development of the baby.  An amniocentesis to check for possible genetic problems.  Fetal screens for spina bifida and Down syndrome. HOME CARE INSTRUCTIONS   Avoid all smoking, herbs, alcohol, and unprescribed   drugs. These chemicals affect the formation and growth of the baby.  Follow your health care provider's instructions regarding medicine use. There are medicines that are either safe or unsafe to take during pregnancy.  Exercise only as directed by your health care provider. Experiencing uterine cramps is a good sign to stop exercising.  Continue to eat regular, healthy meals.  Wear a good support bra for breast tenderness.  Do not use hot tubs, steam rooms, or saunas.  Wear your  seat belt at all times when driving.  Avoid raw meat, uncooked cheese, cat litter boxes, and soil used by cats. These carry germs that can cause birth defects in the baby.  Take your prenatal vitamins.  Try taking a stool softener (if your health care provider approves) if you develop constipation. Eat more high-fiber foods, such as fresh vegetables or fruit and whole grains. Drink plenty of fluids to keep your urine clear or pale yellow.  Take warm sitz baths to soothe any pain or discomfort caused by hemorrhoids. Use hemorrhoid cream if your health care provider approves.  If you develop varicose veins, wear support hose. Elevate your feet for 15 minutes, 3-4 times a day. Limit salt in your diet.  Avoid heavy lifting, wear low heel shoes, and practice good posture.  Rest with your legs elevated if you have leg cramps or low back pain.  Visit your dentist if you have not gone yet during your pregnancy. Use a soft toothbrush to brush your teeth and be gentle when you floss.  A sexual relationship may be continued unless your health care provider directs you otherwise.  Continue to go to all your prenatal visits as directed by your health care provider. SEEK MEDICAL CARE IF:   You have dizziness.  You have mild pelvic cramps, pelvic pressure, or nagging pain in the abdominal area.  You have persistent nausea, vomiting, or diarrhea.  You have a bad smelling vaginal discharge.  You have pain with urination. SEEK IMMEDIATE MEDICAL CARE IF:   You have a fever.  You are leaking fluid from your vagina.  You have spotting or bleeding from your vagina.  You have severe abdominal cramping or pain.  You have rapid weight gain or loss.  You have shortness of breath with chest pain.  You notice sudden or extreme swelling of your face, hands, ankles, feet, or legs.  You have not felt your baby move in over an hour.  You have severe headaches that do not go away with  medicine.  You have vision changes. Document Released: 01/23/2001 Document Revised: 02/03/2013 Document Reviewed: 04/01/2012 ExitCare Patient Information 2015 [employer]. This information is not intended to replace advice given to you by your health care provider. Make sure you discuss any questions you have with your health care provider.  

## 2013-11-25 NOTE — Progress Notes (Signed)
Low-risk OB appointment G1P0 4878w4d Estimated Date of Delivery: 04/10/14 BP 100/64  Wt 137 lb (62.143 kg)  LMP 07/20/2013  BP, weight, and urine reviewed.  Refer to obstetrical flow sheet for FH & FHR.  Reports good fm.  Denies regular uc's, lof, vb, or uti s/s. No complaints. Reviewed today's normal anatomy u/s, ptl s/s, fm. Plan:  Continue routine obstetrical care  F/U in 4wks for OB appointment

## 2013-12-14 ENCOUNTER — Encounter (HOSPITAL_COMMUNITY): Payer: Self-pay | Admitting: *Deleted

## 2013-12-23 ENCOUNTER — Encounter: Payer: Self-pay | Admitting: Advanced Practice Midwife

## 2013-12-23 ENCOUNTER — Ambulatory Visit (INDEPENDENT_AMBULATORY_CARE_PROVIDER_SITE_OTHER): Payer: Medicaid Other | Admitting: Advanced Practice Midwife

## 2013-12-23 VITALS — BP 108/62 | Wt 143.0 lb

## 2013-12-23 DIAGNOSIS — Z3401 Encounter for supervision of normal first pregnancy, first trimester: Secondary | ICD-10-CM

## 2013-12-23 DIAGNOSIS — Z331 Pregnant state, incidental: Secondary | ICD-10-CM

## 2013-12-23 DIAGNOSIS — Z1389 Encounter for screening for other disorder: Secondary | ICD-10-CM

## 2013-12-23 LAB — POCT URINALYSIS DIPSTICK
Blood, UA: NEGATIVE
Glucose, UA: NEGATIVE
Ketones, UA: NEGATIVE
Leukocytes, UA: NEGATIVE
NITRITE UA: NEGATIVE

## 2013-12-23 NOTE — Patient Instructions (Signed)
1. Before your test, do not eat or drink anything for 8-10 hours prior to your  appointment (a small amount of water is allowed and you may take any medicines you normally take). Be sure to drink lots of water the day before. 2. When you arrive, your blood will be drawn for a 'fasting' blood sugar level.  Then you will be given a sweetened carbonated beverage to drink. You should  complete drinking this beverage within five minutes. After finishing the  beverage, you will have your blood drawn exactly 1 and 2 hours later. Having  your blood drawn on time is an important part of this test. A total of three blood  samples will be done. 3. The test takes approximately 2  hours. During the test, do not have anything to  eat or drink. Do not smoke, chew gum (not even sugarless gum) or use breath mints.  4. During the test you should remain close by and seated as much as possible and  avoid walking around. You may want to bring a book or something else to  occupy your time.  5. After your test, you may eat and drink as normal. You may want to bring a snack  to eat after the test is finished. Your provider will advise you as to the results of  this test and any follow-up if necessary  You will also be retested for syphilis, HIV and blood levels (anemia):  You were already tested in the first trimester, but [place] recommends retesting.  Additionally, you will be tested for Type 2 Herpes. MOST people do not know that they have genital herpes, as only around 15% of people have outbreaks.  However, it is still transmittable to other people, including the baby (but only during the birth).  If you test positive for Type 2 Herpes, we place you on a medicine called acyclovir the last 6 weeks of your pregnancy to prevent transmission of the virus to the baby during the birth.    If your sugar test is positive for gestational diabetes, you will be given an phone call and further instructions discussed.   We typically do not call patients with positive herpes results, but will discuss it at your next appointment.  If you wish to know all of your test results before your next appointment, feel free to call the office, or look up your test results on Mychart.  (The range that the lab uses for normal values of the sugar test are not necessarily the range that is used for pregnant women; if your results are within the range, they are definitely normal.  However, if a value is deemed "high" by the lab, it may not be too high for a pregnant woman.  We will need to discuss the normal range if your value(s) fall in the "high" category).     Sometime between 27 and 36 weeks, it is recommended that you and anyone who is going to be in close contact with your baby receive the Tdap booster.  You should receive it EACH pregnancy, regardless of when your last booster was.  You may go to the Health Department (no appointment necessary) or your Primary Care office to receive the vaccine.  If you do not receive the vaccine prior to delivery, it will be offered in the hospital.  However, if you get it at least 2 weeks prior to delivery, you will have the added advantage of passing the immunity to your baby.  Back Pain in Pregnancy Back pain during pregnancy is common. It happens in about half of all pregnancies. It is important for you and your baby that you remain active during your pregnancy.If you feel that back pain is not allowing you to remain active or sleep well, it is time to see your caregiver. Back pain may be caused by several factors related to changes during your pregnancy.Fortunately, unless you had trouble with your back before your pregnancy, the pain is likely to get better after you deliver. Low back pain usually occurs between the fifth and seventh months of pregnancy. It can, however, happen in the first couple months. Factors that increase the risk of back problems include:   Previous back  problems.  Injury to your back.  Having twins or multiple births.  A chronic cough.  Stress.  Job-related repetitive motions.  Muscle or spinal disease in the back.  Family history of back problems, ruptured (herniated) discs, or osteoporosis.  Depression, anxiety, and panic attacks. CAUSES   When you are pregnant, your body produces a hormone called relaxin. This hormonemakes the ligaments connecting the low back and pubic bones more flexible. This flexibility allows the baby to be delivered more easily. When your ligaments are loose, your muscles need to work harder to support your back. Soreness in your back can come from tired muscles. Soreness can also come from back tissues that are irritated since they are receiving less support.  As the baby grows, it puts pressure on the nerves and blood vessels in your pelvis. This can cause back pain.  As the baby grows and gets heavier during pregnancy, the uterus pushes the stomach muscles forward and changes your center of gravity. This makes your back muscles work harder to maintain good posture. SYMPTOMS  Lumbar pain during pregnancy Lumbar pain during pregnancy usually occurs at or above the waist in the center of the back. There may be pain and numbness that radiates into your leg or foot. This is similar to low back pain experienced by non-pregnant women. It usually increases with sitting for long periods of time, standing, or repetitive lifting. Tenderness may also be present in the muscles along your upper back. Posterior pelvic pain during pregnancy Pain in the back of the pelvis is more common than lumbar pain in pregnancy. It is a deep pain felt in your side at the waistline, or across the tailbone (sacrum), or in both places. You may have pain on one or both sides. This pain can also go into the buttocks and backs of the upper thighs. Pubic and groin pain may also be present. The pain does not quickly resolve with rest, and  morning stiffness may also be present. Pelvic pain during pregnancy can be brought on by most activities. A high level of fitness before and during pregnancy may or may not prevent this problem. Labor pain is usually 1 to 2 minutes apart, lasts for about 1 minute, and involves a bearing down feeling or pressure in your pelvis. However, if you are at term with the pregnancy, constant low back pain can be the beginning of early labor, and you should be aware of this. DIAGNOSIS  X-rays of the back should not be done during the first 12 to 14 weeks of the pregnancy and only when absolutely necessary during the rest of the pregnancy. MRIs do not give off radiation and are safe during pregnancy. MRIs also should only be done when absolutely necessary. HOME CARE INSTRUCTIONS  Exercise as directed by your caregiver. Exercise is the most effective way to prevent or manage back pain. If you have a back problem, it is especially important to avoid sports that require sudden body movements. Swimming and walking are great activities.  Do not stand in one place for long periods of time.  Do not wear high heels.  Sit in chairs with good posture. Use a pillow on your lower back if necessary. Make sure your head rests over your shoulders and is not hanging forward.  Try sleeping on your side, preferably the left side, with a pillow or two between your legs. If you are sore after a night's rest, your bedmay betoo soft.Try placing a board between your mattress and box spring.  Listen to your body when lifting.If you are experiencing pain, ask for help or try bending yourknees more so you can use your leg muscles rather than your back muscles. Squat down when picking up something from the floor. Do not bend over.  Eat a healthy diet. Try to gain weight within your caregiver's recommendations.  Use heat or cold packs 3 to 4 times a day for 15 minutes to help with the pain.  Only take over-the-counter or  prescription medicines for pain, discomfort, or fever as directed by your caregiver. Sudden (acute) back pain  Use bed rest for only the most extreme, acute episodes of back pain. Prolonged bed rest over 48 hours will aggravate your condition.  Ice is very effective for acute conditions.  Put ice in a plastic bag.  Place a towel between your skin and the bag.  Leave the ice on for 10 to 20 minutes every 2 hours, or as needed.  Using heat packs for 30 minutes prior to activities is also helpful. Continued back pain See your caregiver if you have continued problems. Your caregiver can help or refer you for appropriate physical therapy. With conditioning, most back problems can be avoided. Sometimes, a more serious issue may be the cause of back pain. You should be seen right away if new problems seem to be developing. Your caregiver may recommend:  A maternity girdle.  An elastic sling.  A back brace.  A massage therapist or acupuncture. SEEK MEDICAL CARE IF:   You are not able to do most of your daily activities, even when taking the pain medicine you were given.  You need a referral to a physical therapist or chiropractor.  You want to try acupuncture. SEEK IMMEDIATE MEDICAL CARE IF:  You develop numbness, tingling, weakness, or problems with the use of your arms or legs.  You develop severe back pain that is no longer relieved with medicines.  You have a sudden change in bowel or bladder control.  You have increasing pain in other areas of the body.  You develop shortness of breath, dizziness, or fainting.  You develop nausea, vomiting, or sweating.  You have back pain which is similar to labor pains.  You have back pain along with your water breaking or vaginal bleeding.  You have back pain or numbness that travels down your leg.  Your back pain developed after you fell.  You develop pain on one side of your back. You may have a kidney stone.  You see blood in  your urine. You may have a bladder infection or kidney stone.  You have back pain with blisters. You may have shingles. Back pain is fairly common during pregnancy but should not be accepted as just part  of the process. Back pain should always be treated as soon as possible. This will make your pregnancy as pleasant as possible. Document Released: 05/09/2005 Document Revised: 04/23/2011 Document Reviewed: 06/20/2010 [facility] Patient Information 2015 [place], [place]. This information is not intended to replace advice given to you by your health care provider. Make sure you discuss any questions you have with your health care provider.

## 2013-12-23 NOTE — Progress Notes (Signed)
G1P0 7471w4d Estimated Date of Delivery: 04/10/14  Blood pressure 108/62, weight 143 lb (64.864 kg), last menstrual period 07/20/2013.   BP weight and urine results all reviewed and noted.  Please refer to the obstetrical flow sheet for the fundal height and fetal heart rate documentation:  Patient reports good fetal movement, denies any bleeding and no rupture of membranes symptoms or regular contractions. Patient is without complaints other than normal pregnancy complaints All questions were answered.  Plan:  Continued routine obstetrical care,   Follow up in 3 weeks for OB appointment, PN2

## 2014-01-13 ENCOUNTER — Ambulatory Visit (INDEPENDENT_AMBULATORY_CARE_PROVIDER_SITE_OTHER): Payer: Medicaid Other | Admitting: Advanced Practice Midwife

## 2014-01-13 ENCOUNTER — Other Ambulatory Visit: Payer: Medicaid Other

## 2014-01-13 VITALS — BP 106/72 | Wt 148.0 lb

## 2014-01-13 DIAGNOSIS — Z331 Pregnant state, incidental: Secondary | ICD-10-CM

## 2014-01-13 DIAGNOSIS — Z131 Encounter for screening for diabetes mellitus: Secondary | ICD-10-CM

## 2014-01-13 DIAGNOSIS — Z3402 Encounter for supervision of normal first pregnancy, second trimester: Secondary | ICD-10-CM

## 2014-01-13 DIAGNOSIS — Z113 Encounter for screening for infections with a predominantly sexual mode of transmission: Secondary | ICD-10-CM

## 2014-01-13 DIAGNOSIS — Z0184 Encounter for antibody response examination: Secondary | ICD-10-CM

## 2014-01-13 DIAGNOSIS — Z114 Encounter for screening for human immunodeficiency virus [HIV]: Secondary | ICD-10-CM

## 2014-01-13 DIAGNOSIS — Z1389 Encounter for screening for other disorder: Secondary | ICD-10-CM

## 2014-01-13 LAB — POCT URINALYSIS DIPSTICK
Glucose, UA: NEGATIVE
Ketones, UA: NEGATIVE
NITRITE UA: NEGATIVE
RBC UA: NEGATIVE

## 2014-01-13 NOTE — Progress Notes (Signed)
G1P0 2750w4d Estimated Date of Delivery: 04/10/14  Blood pressure 106/72, weight 148 lb (67.132 kg), last menstrual period 07/20/2013.   BP weight and urine results all reviewed and noted.  Please refer to the obstetrical flow sheet for the fundal height and fetal heart rate documentation: waterbirth class full this month; not going to do it  Patient reports good fetal movement, denies any bleeding and no rupture of membranes symptoms or regular contractions. Patient is without complaints. All questions were answered.  Plan:  Continued routine obstetrical care, PN2 today  Follow up in 4 weeks for OB appointment,

## 2014-01-14 LAB — HIV ANTIBODY (ROUTINE TESTING W REFLEX): HIV 1&2 Ab, 4th Generation: NONREACTIVE

## 2014-01-14 LAB — GLUCOSE TOLERANCE, 2 HOURS W/ 1HR
GLUCOSE: 107 mg/dL (ref 70–170)
Glucose, 2 hour: 101 mg/dL (ref 70–139)
Glucose, Fasting: 72 mg/dL (ref 70–99)

## 2014-01-14 LAB — CBC
HCT: 34.7 % — ABNORMAL LOW (ref 36.0–46.0)
HEMOGLOBIN: 12 g/dL (ref 12.0–15.0)
MCH: 30.6 pg (ref 26.0–34.0)
MCHC: 34.6 g/dL (ref 30.0–36.0)
MCV: 88.5 fL (ref 78.0–100.0)
MPV: 10.1 fL (ref 9.4–12.4)
Platelets: 212 10*3/uL (ref 150–400)
RBC: 3.92 MIL/uL (ref 3.87–5.11)
RDW: 13.1 % (ref 11.5–15.5)
WBC: 9.3 10*3/uL (ref 4.0–10.5)

## 2014-01-14 LAB — ANTIBODY SCREEN: ANTIBODY SCREEN: NEGATIVE

## 2014-01-14 LAB — RPR

## 2014-01-14 LAB — HSV 2 ANTIBODY, IGG: HSV 2 Glycoprotein G Ab, IgG: <0.1 IV

## 2014-01-20 ENCOUNTER — Telehealth: Payer: Self-pay | Admitting: Obstetrics & Gynecology

## 2014-01-20 NOTE — Telephone Encounter (Signed)
Pt c/o cough, stuffy nose, watery eyes. Informed can take OTC Robitussin, nasal spray, gargle with warm salt water as need for sore throat. If no improvement pt to call office back. Pt also requesting results for GTT on 01/13/2014 all results WNL. Pt verbalized understanding.

## 2014-02-10 ENCOUNTER — Encounter: Payer: Self-pay | Admitting: Women's Health

## 2014-02-10 ENCOUNTER — Ambulatory Visit (INDEPENDENT_AMBULATORY_CARE_PROVIDER_SITE_OTHER): Payer: Medicaid Other | Admitting: Women's Health

## 2014-02-10 VITALS — BP 104/60 | Wt 152.0 lb

## 2014-02-10 DIAGNOSIS — Z1389 Encounter for screening for other disorder: Secondary | ICD-10-CM

## 2014-02-10 DIAGNOSIS — Z331 Pregnant state, incidental: Secondary | ICD-10-CM

## 2014-02-10 DIAGNOSIS — Z3403 Encounter for supervision of normal first pregnancy, third trimester: Secondary | ICD-10-CM

## 2014-02-10 LAB — POCT URINALYSIS DIPSTICK
Blood, UA: NEGATIVE
Glucose, UA: NEGATIVE
Ketones, UA: NEGATIVE
NITRITE UA: NEGATIVE
PROTEIN UA: NEGATIVE

## 2014-02-10 NOTE — Patient Instructions (Addendum)
Claritin or Zyrtec for draining eye  Call the office [phone]) or [facility] if:  You begin to have strong, frequent contractions  Your water breaks.  Sometimes it is a big gush of fluid, sometimes it is just a trickle that keeps getting your panties wet or running down your legs  You have vaginal bleeding.  It is normal to have a small amount of spotting if your cervix was checked.   You don't feel your baby moving like normal.  If you don't, get you something to eat and drink and lay down and focus on feeling your baby move.  You should feel at least 10 movements in 2 hours.  If you don't, you should call the office or [facility].    Tdap Vaccine  It is recommended that you get the Tdap vaccine during the third trimester of EACH pregnancy to help protect your baby from getting pertussis (whooping cough)  27-36 weeks is the BEST time to do this so that you can pass the protection on to your baby. During pregnancy is better than after pregnancy, but if you are unable to get it during pregnancy it will be offered at the hospital.   You can get this vaccine at the health department or your family doctor  Everyone who will be around your baby should also be up-to-date on their vaccines. Adults (who are not pregnant) only need 1 dose of Tdap during adulthood.    Third Trimester of Pregnancy The third trimester is from week 29 through week 42, months 7 through 9. The third trimester is a time when the fetus is growing rapidly. At the end of the ninth month, the fetus is about 20 inches in length and weighs 6-10 pounds.  BODY CHANGES Your body goes through many changes during pregnancy. The changes vary from woman to woman.   Your weight will continue to increase. You can expect to gain 25-35 pounds (11-16 kg) by the end of the pregnancy.  You may begin to get stretch marks on your hips, abdomen, and breasts.  You may urinate more often because the fetus is moving  lower into your pelvis and pressing on your bladder.  You may develop or continue to have heartburn as a result of your pregnancy.  You may develop constipation because certain hormones are causing the muscles that push waste through your intestines to slow down.  You may develop hemorrhoids or swollen, bulging veins (varicose veins).  You may have pelvic pain because of the weight gain and pregnancy hormones relaxing your joints between the bones in your pelvis. Backaches may result from overexertion of the muscles supporting your posture.  You may have changes in your hair. These can include thickening of your hair, rapid growth, and changes in texture. Some women also have hair loss during or after pregnancy, or hair that feels dry or thin. Your hair will most likely return to normal after your baby is born.  Your breasts will continue to grow and be tender. A yellow discharge may leak from your breasts called colostrum.  Your belly button may stick out.  You may feel short of breath because of your expanding uterus.  You may notice the fetus "dropping," or moving lower in your abdomen.  You may have a bloody mucus discharge. This usually occurs a few days to a week before labor begins.  Your cervix becomes thin and soft (effaced) near your due date. WHAT TO EXPECT AT  YOUR PRENATAL EXAMS  You will have prenatal exams every 2 weeks until week 36. Then, you will have weekly prenatal exams. During a routine prenatal visit:  You will be weighed to make sure you and the fetus are growing normally.  Your blood pressure is taken.  Your abdomen will be measured to track your baby's growth.  The fetal heartbeat will be listened to.  Any test results from the previous visit will be discussed.  You may have a cervical check near your due date to see if you have effaced. At around 36 weeks, your caregiver will check your cervix. At the same time, your caregiver will also perform a test on  the secretions of the vaginal tissue. This test is to determine if a type of bacteria, Group B streptococcus, is present. Your caregiver will explain this further. Your caregiver may ask you:  What your birth plan is.  How you are feeling.  If you are feeling the baby move.  If you have had any abnormal symptoms, such as leaking fluid, bleeding, severe headaches, or abdominal cramping.  If you have any questions. Other tests or screenings that may be performed during your third trimester include:  Blood tests that check for low iron levels (anemia).  Fetal testing to check the health, activity level, and growth of the fetus. Testing is done if you have certain medical conditions or if there are problems during the pregnancy. FALSE LABOR You may feel small, irregular contractions that eventually go away. These are called Braxton Hicks contractions, or false labor. Contractions may last for hours, days, or even weeks before true labor sets in. If contractions come at regular intervals, intensify, or become painful, it is best to be seen by your caregiver.  SIGNS OF LABOR   Menstrual-like cramps.  Contractions that are 5 minutes apart or less.  Contractions that start on the top of the uterus and spread down to the lower abdomen and back.  A sense of increased pelvic pressure or back pain.  A watery or bloody mucus discharge that comes from the vagina. If you have any of these signs before the 37th week of pregnancy, call your caregiver right away. You need to go to the hospital to get checked immediately. HOME CARE INSTRUCTIONS   Avoid all smoking, herbs, alcohol, and unprescribed drugs. These chemicals affect the formation and growth of the baby.  Follow your caregiver's instructions regarding medicine use. There are medicines that are either safe or unsafe to take during pregnancy.  Exercise only as directed by your caregiver. Experiencing uterine cramps is a good sign to stop  exercising.  Continue to eat regular, healthy meals.  Wear a good support bra for breast tenderness.  Do not use hot tubs, steam rooms, or saunas.  Wear your seat belt at all times when driving.  Avoid raw meat, uncooked cheese, cat litter boxes, and soil used by cats. These carry germs that can cause birth defects in the baby.  Take your prenatal vitamins.  Try taking a stool softener (if your caregiver approves) if you develop constipation. Eat more high-fiber foods, such as fresh vegetables or fruit and whole grains. Drink plenty of fluids to keep your urine clear or pale yellow.  Take warm sitz baths to soothe any pain or discomfort caused by hemorrhoids. Use hemorrhoid cream if your caregiver approves.  If you develop varicose veins, wear support hose. Elevate your feet for 15 minutes, 3-4 times a day. Limit salt in your  diet.  Avoid heavy lifting, wear low heal shoes, and practice good posture.  Rest a lot with your legs elevated if you have leg cramps or low back pain.  Visit your dentist if you have not gone during your pregnancy. Use a soft toothbrush to brush your teeth and be gentle when you floss.  A sexual relationship may be continued unless your caregiver directs you otherwise.  Do not travel far distances unless it is absolutely necessary and only with the approval of your caregiver.  Take prenatal classes to understand, practice, and ask questions about the labor and delivery.  Make a trial run to the hospital.  Pack your hospital bag.  Prepare the baby's nursery.  Continue to go to all your prenatal visits as directed by your caregiver. SEEK MEDICAL CARE IF:  You are unsure if you are in labor or if your water has broken.  You have dizziness.  You have mild pelvic cramps, pelvic pressure, or nagging pain in your abdominal area.  You have persistent nausea, vomiting, or diarrhea.  You have a bad smelling vaginal discharge.  You have pain with  urination. SEEK IMMEDIATE MEDICAL CARE IF:   You have a fever.  You are leaking fluid from your vagina.  You have spotting or bleeding from your vagina.  You have severe abdominal cramping or pain.  You have rapid weight loss or gain.  You have shortness of breath with chest pain.  You notice sudden or extreme swelling of your face, hands, ankles, feet, or legs.  You have not felt your baby move in over an hour.  You have severe headaches that do not go away with medicine.  You have vision changes. Document Released: 01/23/2001 Document Revised: 02/03/2013 Document Reviewed: 04/01/2012 [facility] Patient Information 2015 [place], [place]. This information is not intended to replace advice given to you by your health care provider. Make sure you discuss any questions you have with your health care provider.

## 2014-02-10 NOTE — Progress Notes (Signed)
Low-risk OB appointment G1P0 3684w4d Estimated Date of Delivery: 04/10/14 BP 104/60 mmHg  Wt 152 lb (68.947 kg)  LMP 07/20/2013  BP, weight, and urine reviewed.  Refer to obstetrical flow sheet for FH & FHR.  Reports good fm.  Denies regular uc's, lof, vb, or uti s/s. Lt eye draining clear fluid x 3wks after URI. To try claritin or zyrtec.  Reviewed normal pn2 results, ptl s/s, fkc. Plan:  Continue routine obstetrical care  F/U in 2wks for OB appointment

## 2014-02-12 NOTE — L&D Delivery Note (Signed)
Operative Delivery Note At 10:36 AM a viable female was delivered via .  Presentation: vertex; Position: Right,, Occiput,, Anterior; Station: +3.  Verbal consent: obtained from patient.  Risks and benefits discussed in detail.  Risks include, but are not limited to the risks of anesthesia, bleeding, infection, damage to maternal tissues, fetal cephalhematoma.  There is also the risk of inability to effect vaginal delivery of the head, or shoulder dystocia that cannot be resolved by established maneuvers, leading to the need for emergency cesarean section.  APGAR:to be assigned ; weight  .   Placenta status:spont via shultz , .   Cord: 3 vessels with the following complications: .  Cord pH: 7.1  Anesthesia: Epidural  Instruments: kiwi Episiotomy:ml Lacerations: none  Suture Repair: 3.0 vicryl Est. Blood Loss150 (mL):    Mom to AICU.  Baby to Couplet care / Skin to Skin.  Debbie Liu, Debbie Liu 04/18/2014, 11:00 AM

## 2014-02-24 ENCOUNTER — Ambulatory Visit (INDEPENDENT_AMBULATORY_CARE_PROVIDER_SITE_OTHER): Payer: Medicaid Other | Admitting: Advanced Practice Midwife

## 2014-02-24 ENCOUNTER — Encounter: Payer: Self-pay | Admitting: Advanced Practice Midwife

## 2014-02-24 VITALS — BP 120/70 | Wt 155.0 lb

## 2014-02-24 DIAGNOSIS — Z331 Pregnant state, incidental: Secondary | ICD-10-CM

## 2014-02-24 DIAGNOSIS — Z1389 Encounter for screening for other disorder: Secondary | ICD-10-CM

## 2014-02-24 DIAGNOSIS — Z3403 Encounter for supervision of normal first pregnancy, third trimester: Secondary | ICD-10-CM

## 2014-02-24 LAB — POCT URINALYSIS DIPSTICK
GLUCOSE UA: NEGATIVE
Ketones, UA: NEGATIVE
Nitrite, UA: NEGATIVE
Protein, UA: NEGATIVE
RBC UA: NEGATIVE

## 2014-02-24 NOTE — Progress Notes (Signed)
G1P0 771w4d Estimated Date of Delivery: 04/10/14  Blood pressure 120/70, weight 155 lb (70.308 kg), last menstrual period 07/20/2013.   BP weight and urine results all reviewed and noted.  Please refer to the obstetrical flow sheet for the fundal height and fetal heart rate documentation:  Patient reports good fetal movement, denies any bleeding and no rupture of membranes symptoms or regular contractions. Patient is without complaints. All questions were answered.  Plan:  Continued routine obstetrical care,   Follow up in 2 weeks for OB appointment,

## 2014-02-24 NOTE — Addendum Note (Signed)
Addended by: Criss AlvinePULLIAM, CHRYSTAL G on: 02/24/2014 01:41 PM   Modules accepted: Orders

## 2014-03-01 ENCOUNTER — Telehealth: Payer: Self-pay | Admitting: Women's Health

## 2014-03-01 NOTE — Telephone Encounter (Signed)
Pt states she is having cough, congestion and sore throat.  She has not taken any medication, advised her to use tylenol for the sore throat, try either sudafed or mucinex for the congestion, robitussin for cough and push fluids.  If symptoms worsen or not improving call us back and we will get her in to be seen.  Pt verbalized understanding.

## 2014-03-03 ENCOUNTER — Telehealth: Payer: Self-pay | Admitting: *Deleted

## 2014-03-03 MED ORDER — GENTAMICIN SULFATE 0.3 % OP SOLN: 1.0000 [drp] | OPHTHALMIC | Status: DC

## 2014-03-03 NOTE — Telephone Encounter (Signed)
Pink eye.  Gentamicin drops sent to pharmacy

## 2014-03-10 ENCOUNTER — Ambulatory Visit (INDEPENDENT_AMBULATORY_CARE_PROVIDER_SITE_OTHER): Payer: Medicaid Other | Admitting: Women's Health

## 2014-03-10 ENCOUNTER — Encounter: Payer: Self-pay | Admitting: Women's Health

## 2014-03-10 VITALS — BP 120/80 | Wt 156.4 lb

## 2014-03-10 DIAGNOSIS — Z1389 Encounter for screening for other disorder: Secondary | ICD-10-CM

## 2014-03-10 DIAGNOSIS — Z331 Pregnant state, incidental: Secondary | ICD-10-CM

## 2014-03-10 DIAGNOSIS — Z3403 Encounter for supervision of normal first pregnancy, third trimester: Secondary | ICD-10-CM

## 2014-03-10 LAB — POCT URINALYSIS DIPSTICK
GLUCOSE UA: NEGATIVE
KETONES UA: NEGATIVE
LEUKOCYTES UA: NEGATIVE
Nitrite, UA: NEGATIVE
Protein, UA: NEGATIVE
RBC UA: NEGATIVE

## 2014-03-10 NOTE — Patient Instructions (Signed)
Call the office ([phone]) or go to [facility] if:  You begin to have strong, frequent contractions  Your water breaks.  Sometimes it is a big gush of fluid, sometimes it is just a trickle that keeps getting your panties wet or running down your legs  You have vaginal bleeding.  It is normal to have a small amount of spotting if your cervix was checked.   You don't feel your baby moving like normal.  If you don't, get you something to eat and drink and lay down and focus on feeling your baby move.  You should feel at least 10 movements in 2 hours.  If you don't, you should call the office or go to [facility].    Preterm Labor Information Preterm labor is when labor starts at less than 37 weeks of pregnancy. The normal length of a pregnancy is 39 to 41 weeks. CAUSES Often, there is no identifiable underlying cause as to why a woman goes into preterm labor. One of the most common known causes of preterm labor is infection. Infections of the uterus, cervix, vagina, amniotic sac, bladder, kidney, or even the lungs (pneumonia) can cause labor to start. Other suspected causes of preterm labor include:   Urogenital infections, such as yeast infections and bacterial vaginosis.   Uterine abnormalities (uterine shape, uterine septum, fibroids, or bleeding from the placenta).   A cervix that has been operated on (it may fail to stay closed).   Malformations in the fetus.   Multiple gestations (twins, triplets, and so on).   Breakage of the amniotic sac.  RISK FACTORS  Having a previous history of preterm labor.   Having premature rupture of membranes (PROM).   Having a placenta that covers the opening of the cervix (placenta previa).   Having a placenta that separates from the uterus (placental abruption).   Having a cervix that is too weak to hold the fetus in the uterus (incompetent cervix).   Having too much fluid in the amniotic sac (polyhydramnios).   Taking  illegal drugs or smoking while pregnant.   Not gaining enough weight while pregnant.   Being younger than 18 and older than 21 years old.   Having a low socioeconomic status.   Being African American. SYMPTOMS Signs and symptoms of preterm labor include:   Menstrual-like cramps, abdominal pain, or back pain.  Uterine contractions that are regular, as frequent as six in an hour, regardless of their intensity (may be mild or painful).  Contractions that start on the top of the uterus and spread down to the lower abdomen and back.   A sense of increased pelvic pressure.   A watery or bloody mucus discharge that comes from the vagina.  TREATMENT Depending on the length of the pregnancy and other circumstances, your health care provider may suggest bed rest. If necessary, there are medicines that can be given to stop contractions and to mature the fetal lungs. If labor happens before 34 weeks of pregnancy, a prolonged hospital stay may be recommended. Treatment depends on the condition of both you and the fetus.  WHAT SHOULD YOU DO IF YOU THINK YOU ARE IN PRETERM LABOR? Call your health care provider right away. You will need to go to the hospital to get checked immediately. HOW CAN YOU PREVENT PRETERM LABOR IN FUTURE PREGNANCIES? You should:   Stop smoking if you smoke.  Maintain healthy weight gain and avoid chemicals and drugs that are not necessary.  Be watchful for   any type of infection.  Inform your health care provider if you have a known history of preterm labor. Document Released: 04/21/2003 Document Revised: 10/01/2012 Document Reviewed: 03/03/2012 ExitCare Patient Information 2015 [employer]. This information is not intended to replace advice given to you by your health care provider. Make sure you discuss any questions you have with your health care provider.  

## 2014-03-10 NOTE — Progress Notes (Signed)
Low-risk OB appointment G1P0 6330w4d Estimated Date of Delivery: 04/10/14 BP 120/80 mmHg  Wt 156 lb 6.4 oz (70.943 kg)  LMP 07/20/2013  BP, weight, and urine reviewed.  Refer to obstetrical flow sheet for FH & FHR.  Reports good fm.  Denies regular uc's, lof, vb, or uti s/s. No complaints. Reviewed ptl s/s, fkc. Plan:  Continue routine obstetrical care  F/U in 1wk for OB appointment and gbs

## 2014-03-17 ENCOUNTER — Encounter: Payer: Self-pay | Admitting: Advanced Practice Midwife

## 2014-03-17 ENCOUNTER — Ambulatory Visit (INDEPENDENT_AMBULATORY_CARE_PROVIDER_SITE_OTHER): Payer: Medicaid Other | Admitting: Advanced Practice Midwife

## 2014-03-17 VITALS — BP 120/80 | Wt 162.0 lb

## 2014-03-17 DIAGNOSIS — Z1389 Encounter for screening for other disorder: Secondary | ICD-10-CM

## 2014-03-17 DIAGNOSIS — Z331 Pregnant state, incidental: Secondary | ICD-10-CM

## 2014-03-17 DIAGNOSIS — Z3685 Encounter for antenatal screening for Streptococcus B: Secondary | ICD-10-CM

## 2014-03-17 DIAGNOSIS — Z118 Encounter for screening for other infectious and parasitic diseases: Secondary | ICD-10-CM

## 2014-03-17 DIAGNOSIS — Z3403 Encounter for supervision of normal first pregnancy, third trimester: Secondary | ICD-10-CM

## 2014-03-17 LAB — OB RESULTS CONSOLE GBS: STREP GROUP B AG: NEGATIVE

## 2014-03-17 NOTE — Progress Notes (Signed)
G1P0 6939w4d Estimated Date of Delivery: 04/10/14  Blood pressure 120/80, weight 73.483 kg (162 lb), last menstrual period 07/20/2013.   BP weight and urine results all reviewed and noted.  Please refer to the obstetrical flow sheet for the fundal height and fetal heart rate documentation:  Patient reports good fetal movement, denies any bleeding and no rupture of membranes symptoms or regular contractions. Patient is without complaints. All questions were answered.  Plan:  Continued routine obstetrical care,   Follow up in 1 weeks for OB appointment,

## 2014-03-19 LAB — GC/CHLAMYDIA PROBE AMP
Chlamydia trachomatis, NAA: NEGATIVE
Neisseria gonorrhoeae by PCR: NEGATIVE

## 2014-03-21 LAB — CULTURE, BETA STREP (GROUP B ONLY): Strep Gp B Culture: NEGATIVE

## 2014-03-24 ENCOUNTER — Encounter: Payer: Self-pay | Admitting: Advanced Practice Midwife

## 2014-03-24 ENCOUNTER — Ambulatory Visit (INDEPENDENT_AMBULATORY_CARE_PROVIDER_SITE_OTHER): Payer: Medicaid Other | Admitting: Advanced Practice Midwife

## 2014-03-24 VITALS — BP 128/80 | Wt 163.4 lb

## 2014-03-24 DIAGNOSIS — Z1389 Encounter for screening for other disorder: Secondary | ICD-10-CM

## 2014-03-24 DIAGNOSIS — R1011 Right upper quadrant pain: Secondary | ICD-10-CM

## 2014-03-24 DIAGNOSIS — Z331 Pregnant state, incidental: Secondary | ICD-10-CM

## 2014-03-24 DIAGNOSIS — Z3403 Encounter for supervision of normal first pregnancy, third trimester: Secondary | ICD-10-CM

## 2014-03-24 LAB — POCT URINALYSIS DIPSTICK
Glucose, UA: NEGATIVE
Glucose, UA: NEGATIVE
Ketones, UA: NEGATIVE
Nitrite, UA: NEGATIVE
PROTEIN UA: NEGATIVE

## 2014-03-24 NOTE — Progress Notes (Signed)
G1P0 6941w4d Estimated Date of Delivery: 04/10/14  Blood pressure 128/80, weight 163 lb 6.4 oz (74.118 kg), last menstrual period 07/20/2013.   BP weight and urine results all reviewed and noted.  Please refer to the obstetrical flow sheet for the fundal height and fetal heart rate documentation:  Patient reports good fetal movement, denies any bleeding and no rupture of membranes symptoms or regular contractions. Patient c.o RUQ pain intermittently for a few weeks.  Not tender. No nausea/vomiting All questions were answered.  Plan:  Continued routine obstetrical care, LFT's  Follow up in 1 weeks for OB appointment,

## 2014-03-25 LAB — HEPATIC FUNCTION PANEL
ALT: 10 IU/L (ref 0–32)
AST: 12 IU/L (ref 0–40)
Albumin: 3.4 g/dL — ABNORMAL LOW (ref 3.5–5.5)
Alkaline Phosphatase: 152 IU/L — ABNORMAL HIGH (ref 39–117)
BILIRUBIN, DIRECT: 0.04 mg/dL (ref 0.00–0.40)
Total Protein: 6.2 g/dL (ref 6.0–8.5)

## 2014-03-31 ENCOUNTER — Ambulatory Visit (INDEPENDENT_AMBULATORY_CARE_PROVIDER_SITE_OTHER): Payer: Medicaid Other | Admitting: Obstetrics and Gynecology

## 2014-03-31 ENCOUNTER — Encounter: Payer: Self-pay | Admitting: Obstetrics and Gynecology

## 2014-03-31 VITALS — BP 100/60 | Wt 170.0 lb

## 2014-03-31 DIAGNOSIS — Z331 Pregnant state, incidental: Secondary | ICD-10-CM

## 2014-03-31 DIAGNOSIS — Z3403 Encounter for supervision of normal first pregnancy, third trimester: Secondary | ICD-10-CM

## 2014-03-31 DIAGNOSIS — Z1389 Encounter for screening for other disorder: Secondary | ICD-10-CM

## 2014-03-31 LAB — POCT URINALYSIS DIPSTICK
Blood, UA: NEGATIVE
GLUCOSE UA: NEGATIVE
Ketones, UA: NEGATIVE
Leukocytes, UA: NEGATIVE
NITRITE UA: NEGATIVE

## 2014-03-31 NOTE — Progress Notes (Signed)
G1P0 9882w4d Estimated Date of Delivery: 04/10/14 classes none Blood pressure 100/60, weight 170 lb (77.111 kg), last menstrual period 07/20/2013.   refer to the ob flow sheet for FH and FHR, also BP, Wt, Urine results:notable for negative, x trace protein  Patient reports   good fetal movement, denies any bleeding and no rupture of membranes symptoms or regular contractions. Patient complaints:notes rib cage pain. On right  Infrequently,assoc'd with movement. Had LFT last wk that were normal  A. Musculoskeletal c/o     Cervical favorability Questions were answered. Assessment:  Plan:  Continued routine obstetrical care, reck weekly  F/u in 1 weeks for pnx visit

## 2014-03-31 NOTE — Progress Notes (Signed)
Pt denies any problems or concerns at this time.  

## 2014-04-09 ENCOUNTER — Ambulatory Visit (INDEPENDENT_AMBULATORY_CARE_PROVIDER_SITE_OTHER): Payer: Medicaid Other | Admitting: Obstetrics and Gynecology

## 2014-04-09 DIAGNOSIS — Z331 Pregnant state, incidental: Secondary | ICD-10-CM

## 2014-04-09 DIAGNOSIS — Z1389 Encounter for screening for other disorder: Secondary | ICD-10-CM

## 2014-04-09 DIAGNOSIS — Z3403 Encounter for supervision of normal first pregnancy, third trimester: Secondary | ICD-10-CM

## 2014-04-09 LAB — POCT URINALYSIS DIPSTICK
Blood, UA: NEGATIVE
Glucose, UA: NEGATIVE
Ketones, UA: NEGATIVE
Leukocytes, UA: NEGATIVE
NITRITE UA: NEGATIVE
Protein, UA: NEGATIVE

## 2014-04-09 NOTE — Progress Notes (Signed)
Patient ID: Debbie Liu, female   DOB: 08/23/1995, 19 y.o.   MRN: [id number]  G1P0 559w6d Estimated Date of Delivery: 04/10/14  Blood pressure 122/80, pulse 84, weight 176 lb (79.833 kg), last menstrual period 07/20/2013.   refer to the ob flow sheet for FH and FHR, also BP, Wt, Urine results:notable for negative  Patient reports  + good fetal movement, denies any bleeding and no rupture of membranes symptoms or regular contractions. Patient complaints:none. Pt presents to check cervix prior to delivery. She has not enrolled in parenting classes or taken a tour of the ward. Father will be present at birth for support.   FHR 141 FH 38 cm  Questions were answered. Assessment: 5859w6d, G1P0 Cervix: soft, 1.5 cm  Vertex, 40%  -2  Plan:  Continued routine obstetrical care  F/u in 1 weeks for routine care   This chart was scribed for Tilda BurrowJohn Cariana Karge V, MD by Gwenyth Oberatherine Macek, ED Scribe. This patient was seen in room 3 and the patient's care was started at 9:43 AM.   I personally performed the services described in this documentation, which was scribed in my presence. The recorded information has been reviewed and considered accurate. It has been edited as necessary during review. Tilda BurrowFERGUSON,Labaron Digirolamo V, MD

## 2014-04-09 NOTE — Progress Notes (Signed)
Pt denies any problems or concerns at this time.  

## 2014-04-16 ENCOUNTER — Ambulatory Visit (INDEPENDENT_AMBULATORY_CARE_PROVIDER_SITE_OTHER): Payer: Medicaid Other | Admitting: Obstetrics & Gynecology

## 2014-04-16 ENCOUNTER — Encounter: Payer: Self-pay | Admitting: Obstetrics & Gynecology

## 2014-04-16 ENCOUNTER — Telehealth (HOSPITAL_COMMUNITY): Payer: Self-pay | Admitting: *Deleted

## 2014-04-16 VITALS — BP 120/84 | HR 80 | Wt 177.0 lb

## 2014-04-16 DIAGNOSIS — Z331 Pregnant state, incidental: Secondary | ICD-10-CM

## 2014-04-16 DIAGNOSIS — Z1389 Encounter for screening for other disorder: Secondary | ICD-10-CM

## 2014-04-16 DIAGNOSIS — Z3403 Encounter for supervision of normal first pregnancy, third trimester: Secondary | ICD-10-CM

## 2014-04-16 LAB — POCT URINALYSIS DIPSTICK
Blood, UA: NEGATIVE
GLUCOSE UA: NEGATIVE
KETONES UA: NEGATIVE
LEUKOCYTES UA: NEGATIVE
NITRITE UA: NEGATIVE
PROTEIN UA: 2

## 2014-04-16 NOTE — Progress Notes (Signed)
Pt is concerned about induction and possibilities of c-section.

## 2014-04-16 NOTE — Progress Notes (Signed)
G1P0 1120w6d Estimated Date of Delivery: 04/10/14  Blood pressure 120/84, pulse 80, weight 177 lb (80.287 kg), last menstrual period 07/20/2013.   BP weight and urine results all reviewed and noted.  Lots of cervical mucous hence 2+ protein  Please refer to the obstetrical flow sheet for the fundal height and fetal heart rate documentation:  Patient reports good fetal movement, denies any bleeding and no rupture of membranes symptoms or regular contractions. Patient is without complaints. All questions were answered.  Plan:  Continued routine obstetrical care,   Follow up in 6 weeks for OB appointment, pp exam  Induction for 04/19/2014

## 2014-04-16 NOTE — Telephone Encounter (Signed)
Preadmission screen  

## 2014-04-17 ENCOUNTER — Encounter (HOSPITAL_COMMUNITY): Payer: Self-pay | Admitting: *Deleted

## 2014-04-17 ENCOUNTER — Inpatient Hospital Stay (HOSPITAL_COMMUNITY)
Admission: AD | Admit: 2014-04-17 | Discharge: 2014-04-20 | DRG: 774 | Disposition: A | Discharge status: Home / Self Care | Payer: Medicaid Other | Source: Ambulatory Visit | Attending: Family Medicine | Admitting: Family Medicine

## 2014-04-17 DIAGNOSIS — Z3A41 41 weeks gestation of pregnancy: Secondary | ICD-10-CM | POA: Diagnosis present

## 2014-04-17 DIAGNOSIS — O1413 Severe pre-eclampsia, third trimester: Principal | ICD-10-CM | POA: Diagnosis present

## 2014-04-17 DIAGNOSIS — O480 Post-term pregnancy: Secondary | ICD-10-CM | POA: Diagnosis present

## 2014-04-17 DIAGNOSIS — Z3403 Encounter for supervision of normal first pregnancy, third trimester: Secondary | ICD-10-CM | POA: Diagnosis not present

## 2014-04-17 HISTORY — DX: Unspecified infectious disease: B99.9

## 2014-04-17 LAB — TYPE AND SCREEN
ABO/RH(D): A POS
Antibody Screen: NEGATIVE

## 2014-04-17 LAB — CBC
HCT: 34.6 % — ABNORMAL LOW (ref 36.0–46.0)
HEMOGLOBIN: 11.3 g/dL — AB (ref 12.0–15.0)
MCH: 28 pg (ref 26.0–34.0)
MCHC: 32.7 g/dL (ref 30.0–36.0)
MCV: 85.6 fL (ref 78.0–100.0)
PLATELETS: 210 10*3/uL (ref 150–400)
RBC: 4.04 MIL/uL (ref 3.87–5.11)
RDW: 14.2 % (ref 11.5–15.5)
WBC: 17.7 10*3/uL — AB (ref 4.0–10.5)

## 2014-04-17 LAB — COMPREHENSIVE METABOLIC PANEL
ALBUMIN: 3.1 g/dL — AB (ref 3.5–5.2)
ALK PHOS: 159 U/L — AB (ref 39–117)
ALT: 18 U/L (ref 0–35)
ANION GAP: 9 (ref 5–15)
AST: 20 U/L (ref 0–37)
BUN: 15 mg/dL (ref 6–23)
CALCIUM: 8.6 mg/dL (ref 8.4–10.5)
CHLORIDE: 104 mmol/L (ref 96–112)
CO2: 22 mmol/L (ref 19–32)
CREATININE: 0.76 mg/dL (ref 0.50–1.10)
GFR calc Af Amer: >90 mL/min (ref >90)
GFR calc non Af Amer: >90 mL/min (ref >90)
Glucose, Bld: 81 mg/dL (ref 70–99)
Potassium: 4.2 mmol/L (ref 3.5–5.1)
SODIUM: 135 mmol/L (ref 135–145)
Total Bilirubin: 0.5 mg/dL (ref 0.3–1.2)
Total Protein: 6.3 g/dL (ref 6.0–8.3)

## 2014-04-17 LAB — PROTEIN / CREATININE RATIO, URINE
Creatinine, Urine: 187 mg/dL
Protein Creatinine Ratio: 0.42 — ABNORMAL HIGH (ref 0.00–0.15)
TOTAL PROTEIN, URINE: 79 mg/dL

## 2014-04-17 LAB — ABO/RH: ABO/RH(D): A POS

## 2014-04-17 MED ORDER — MAGNESIUM SULFATE BOLUS VIA INFUSION
4.0000 g | Freq: Once | INTRAVENOUS | Status: AC
  Administered 2014-04-17: 4 g via INTRAVENOUS
  Filled 2014-04-17: qty 500

## 2014-04-17 MED ORDER — NALOXONE HCL 0.4 MG/ML IJ SOLN: 0.4000 mg | INTRAMUSCULAR | Status: DC | PRN

## 2014-04-17 MED ORDER — SODIUM CHLORIDE 0.9 % IJ SOLN: 9.0000 mL | INTRAMUSCULAR | Status: DC | PRN

## 2014-04-17 MED ORDER — LIDOCAINE HCL (PF) 1 % IJ SOLN
30.0000 mL | INTRAMUSCULAR | Status: DC | PRN
  Filled 2014-04-17: qty 30

## 2014-04-17 MED ORDER — ONDANSETRON HCL 4 MG/2ML IJ SOLN: 4.0000 mg | Freq: Four times a day (QID) | INTRAMUSCULAR | Status: DC | PRN

## 2014-04-17 MED ORDER — OXYTOCIN BOLUS FROM INFUSION: 500.0000 mL | INTRAVENOUS | Status: DC

## 2014-04-17 MED ORDER — MAGNESIUM SULFATE 40 G IN LACTATED RINGERS - SIMPLE
2.0000 g/h | INTRAVENOUS | Status: DC
  Administered 2014-04-18: 2 g/h via INTRAVENOUS
  Filled 2014-04-17 (×2): qty 500

## 2014-04-17 MED ORDER — ONDANSETRON HCL 4 MG/2ML IJ SOLN
4.0000 mg | Freq: Four times a day (QID) | INTRAMUSCULAR | Status: DC | PRN
  Administered 2014-04-18: 4 mg via INTRAVENOUS
  Filled 2014-04-17: qty 2

## 2014-04-17 MED ORDER — PHENYLEPHRINE 40 MCG/ML (10ML) SYRINGE FOR IV PUSH (FOR BLOOD PRESSURE SUPPORT)
80.0000 ug | PREFILLED_SYRINGE | INTRAVENOUS | Status: DC | PRN
  Filled 2014-04-17: qty 2

## 2014-04-17 MED ORDER — EPHEDRINE 5 MG/ML INJ
10.0000 mg | INTRAVENOUS | Status: DC | PRN
  Filled 2014-04-17: qty 2

## 2014-04-17 MED ORDER — OXYTOCIN 40 UNITS IN LACTATED RINGERS INFUSION - SIMPLE MED
1.0000 m[IU]/min | INTRAVENOUS | Status: DC
  Administered 2014-04-17: 2 m[IU]/min via INTRAVENOUS
  Administered 2014-04-18: 6 m[IU]/min via INTRAVENOUS
  Administered 2014-04-18: 4 m[IU]/min via INTRAVENOUS
  Administered 2014-04-18: 8 m[IU]/min via INTRAVENOUS

## 2014-04-17 MED ORDER — OXYTOCIN 40 UNITS IN LACTATED RINGERS INFUSION - SIMPLE MED
62.5000 mL/h | INTRAVENOUS | Status: DC
  Administered 2014-04-18: 62.5 mL/h via INTRAVENOUS
  Filled 2014-04-17: qty 1000

## 2014-04-17 MED ORDER — DIPHENHYDRAMINE HCL 50 MG/ML IJ SOLN
12.5000 mg | INTRAMUSCULAR | Status: DC | PRN
  Administered 2014-04-18: 12.5 mg via INTRAVENOUS
  Filled 2014-04-17: qty 1

## 2014-04-17 MED ORDER — LACTATED RINGERS IV SOLN
INTRAVENOUS | Status: DC
  Administered 2014-04-17 – 2014-04-18 (×5): via INTRAVENOUS

## 2014-04-17 MED ORDER — TERBUTALINE SULFATE 1 MG/ML IJ SOLN: 0.2500 mg | Freq: Once | INTRAMUSCULAR | Status: AC | PRN

## 2014-04-17 MED ORDER — DIPHENHYDRAMINE HCL 12.5 MG/5ML PO ELIX
12.5000 mg | ORAL_SOLUTION | Freq: Four times a day (QID) | ORAL | Status: DC | PRN
  Filled 2014-04-17: qty 5

## 2014-04-17 MED ORDER — FENTANYL CITRATE 0.05 MG/ML IJ SOLN: 100.0000 ug | INTRAMUSCULAR | Status: DC | PRN

## 2014-04-17 MED ORDER — ACETAMINOPHEN 325 MG PO TABS: 650.0000 mg | ORAL_TABLET | ORAL | Status: DC | PRN

## 2014-04-17 MED ORDER — FENTANYL 2.5 MCG/ML BUPIVACAINE 1/10 % EPIDURAL INFUSION (WH - ANES)
14.0000 mL/h | INTRAMUSCULAR | Status: DC | PRN
  Administered 2014-04-18: 14 mL/h via EPIDURAL
  Filled 2014-04-17: qty 125

## 2014-04-17 MED ORDER — LACTATED RINGERS IV SOLN: 500.0000 mL | INTRAVENOUS | Status: DC | PRN

## 2014-04-17 MED ORDER — CITRIC ACID-SODIUM CITRATE 334-500 MG/5ML PO SOLN: 30.0000 mL | ORAL | Status: DC | PRN

## 2014-04-17 MED ORDER — OXYCODONE-ACETAMINOPHEN 5-325 MG PO TABS: 2.0000 | ORAL_TABLET | ORAL | Status: DC | PRN

## 2014-04-17 MED ORDER — FENTANYL CITRATE 0.05 MG/ML IJ SOLN
100.0000 ug | INTRAMUSCULAR | Status: DC | PRN
  Administered 2014-04-17 (×5): 100 ug via INTRAVENOUS
  Filled 2014-04-17 (×4): qty 2

## 2014-04-17 MED ORDER — OXYCODONE-ACETAMINOPHEN 5-325 MG PO TABS: 1.0000 | ORAL_TABLET | ORAL | Status: DC | PRN

## 2014-04-17 MED ORDER — FENTANYL 10 MCG/ML IV SOLN
INTRAVENOUS | Status: DC
  Administered 2014-04-17: 21:00:00 via INTRAVENOUS
  Administered 2014-04-18: 330 ug via INTRAVENOUS
  Administered 2014-04-18: 01:00:00 via INTRAVENOUS
  Filled 2014-04-17 (×2): qty 50

## 2014-04-17 MED ORDER — FENTANYL CITRATE 0.05 MG/ML IJ SOLN
INTRAMUSCULAR | Status: AC
  Filled 2014-04-17: qty 2

## 2014-04-17 MED ORDER — PHENYLEPHRINE 40 MCG/ML (10ML) SYRINGE FOR IV PUSH (FOR BLOOD PRESSURE SUPPORT)
80.0000 ug | PREFILLED_SYRINGE | INTRAVENOUS | Status: DC | PRN
  Filled 2014-04-17: qty 2
  Filled 2014-04-17: qty 20

## 2014-04-17 MED ORDER — LACTATED RINGERS IV SOLN: 500.0000 mL | Freq: Once | INTRAVENOUS | Status: DC

## 2014-04-17 MED ORDER — DIPHENHYDRAMINE HCL 50 MG/ML IJ SOLN: 12.5000 mg | Freq: Four times a day (QID) | INTRAMUSCULAR | Status: DC | PRN

## 2014-04-17 NOTE — Progress Notes (Signed)
BP noted. Pt denies any BP issues with pregnancy. Denies HA, Blurred Vision, Epigastic Pain, Scatoma. Will notify MD.

## 2014-04-17 NOTE — Progress Notes (Signed)
LABOR PROGRESS NOTE  Baird Cancerabitha Gassen is a 19 y.o. G1P0 at 4568w0d  admitted for in active labor, found to have preeclampsia  Subjective: Feeling more comfortable with fentanyl PCA.  No HA/visual changes/RUQ pain  Objective: BP 144/101 mmHg  Pulse 89  Temp(Src) 97.5 F (36.4 C) (Oral)  Resp 18  Ht 5\' 4"  (1.626 m)  Wt 175 lb 8 oz (79.606 kg)  BMI 30.11 kg/m2  SpO2 96%  LMP 07/20/2013 or  Filed Vitals:   04/17/14 1924 04/17/14 1952 04/17/14 2040 04/17/14 2051  BP: 164/92 155/92 144/101   Pulse: 74 73 76 89  Temp:      TempSrc:      Resp:  18    Height:      Weight:      SpO2:    96%    Cat I Dilation: 9 Effacement (%): 90 Cervical Position: Anterior Station: 0 Presentation: Vertex Exam by:: Dr. Loreta AveAcosta  Labs: Lab Results  Component Value Date   WBC 17.7* 04/17/2014   HGB 11.3* 04/17/2014   HCT 34.6* 04/17/2014   MCV 85.6 04/17/2014   PLT 210 04/17/2014    Patient Active Problem List   Diagnosis Date Noted  . Susceptible to varicella (non-immune), currently pregnant 09/23/2013  . Supervision of normal first pregnancy 09/15/2013    Assessment / Plan: 19 y.o. G1P0 at 2368w0d here for in active labor now with preeclampsia with severe features  Labor: expectant mgmt Fetal Wellbeing:  CAT i Pain Control:  pca FENTANYL Anticipated MOD:  SVD  PreE: now with severe fx (severe range BP) => start mag - elev uPC  Justen Fonda ROCIO, MD 04/17/2014, 9:22 PM

## 2014-04-17 NOTE — Progress Notes (Signed)
PCA Fentanyl ordered. Awaiting its arrival. Patient at 10 pain score. While waiting IV dose given.

## 2014-04-17 NOTE — Plan of Care (Signed)
Problem: Phase I Progression Outcomes Goal: Assess per MD/Nurse,Routine-VS,FHR,UC,Head to Toe assess Outcome: Progressing Noted BP. Discussed with patient elevated pressures and need for close monitoring.

## 2014-04-17 NOTE — MAU Note (Signed)
Pt states was 3cm in office yesterday before membranes were swept. Denies lof. Does note bloody show. Contractions now q 3-4 minutes apart.

## 2014-04-17 NOTE — H&P (Signed)
LABOR ADMISSION HISTORY AND PHYSICAL  Debbie Liu is a 19 y.o. female G1P0 with IUP at [redacted]w[redacted]d by [redacted]w[redacted]d redating sono presenting for labor eval in active labor. She reports +FMs, No LOF, no VB, no blurry vision, headaches or peripheral edema, and RUQ pain.  She plans on breast feeding. She request nexplanon for birth control.  Dating: By [redacted]w[redacted]d --->  Estimated Date of Delivery: 04/10/14   Prenatal History/Complications:  Past Medical History: Past Medical History  Diagnosis Date  . Medical history non-contributory   . Infection     UTI    Past Surgical History: Past Surgical History  Procedure Laterality Date  . Tonsillectomy      Obstetrical History: OB History    Gravida Para Term Preterm AB TAB SAB Ectopic Multiple Living   1               Social History: History   Social History  . Marital Status: Single    Spouse Name: N/A  . Number of Children: N/A  . Years of Education: N/A   Social History Main Topics  . Smoking status: Never Smoker   . Smokeless tobacco: Never Used  . Alcohol Use: No     Comment: proir to pregnancy  . Drug Use: No  . Sexual Activity: Yes   Other Topics Concern  . None   Social History Narrative    Family History: Family History  Problem Relation Age of Onset  . Thyroid disease Mother   . Cancer Paternal Grandmother     breast  . Diabetes Paternal Grandfather     Allergies: No Known Allergies  Prescriptions prior to admission  Medication Sig Dispense Refill Last Dose  . Prenatal Vit-Fe Fumarate-FA (PRENATAL MULTIVITAMIN) TABS tablet Take 1 tablet by mouth daily at 12 noon.   Past Week at Unknown time  . gentamicin (GARAMYCIN) 0.3 % ophthalmic solution Place 1 drop into the left eye every 4 (four) hours. (Patient not taking: Reported on 03/10/2014) 5 mL 0 Not Taking  . methylPREDNIsolone (MEDROL DOSPACK) 4 MG tablet follow package directions (Patient not taking: Reported on 01/13/2014) 21 tablet 0 Not Taking     Review of  Systems   All systems reviewed and negative except as stated in HPI  Blood pressure 134/86, pulse 80, temperature 98.1 F (36.7 C), temperature source Oral, resp. rate 18, height  (1.626 m), weight 175 lb 8 oz (79.606 kg), last menstrual period 07/20/2013. General appearance: alert and cooperative Lungs: clear to auscultation bilaterally Heart: regular rate and rhythm Abdomen: soft, non-tender; bowel sounds normal Extremities: Homans sign is negative, no sign of DVT Presentation: cephalic Fetal monitoringBaseline: 140 bpm, Variability: Good {> 6 bpm), Accelerations: Reactive and Decelerations: Absent Uterine activityq33min  Dilation: 5 Effacement (%): 90 Station: -2 Exam by:: jolynn   Prenatal labs: ABO, Rh: A/POS/-- (08/04 1534) Antibody: NEG (12/02 0935) Rubella:   RPR: NON REAC (12/02 0935)  HBsAg: NEGATIVE (08/04 1534)  HIV: NONREACTIVE (12/02 0935)  GBS: Negative (02/03 0000)  2 hr Glucola 72/107/101 Genetic screening  neg Anatomy US normal   [facility]  [place], [place], 2nd baby, dating  Dating By 9wk u/s  Pap <21  GC/CT Initial:    -/-            36+wks:  Genetic Screen NT/IT: neg  CF screen neg  Anatomic [place] normal  Flu vaccine Recommended, declined  Tdap Recommended ~ 28wks  Glucose Screen  2 hr  72/107/101  GBS negative  Feed Preference breast  Contraception nexplanon  Circumcision Yes, if boy  Childbirth Classes Interested, info given- didn't make it- recommended tour  [facility]     No results found for this or any previous visit (from the past 24 hour(s)).  Patient Active Problem List   Diagnosis Date Noted  . Susceptible to varicella (non-immune), currently pregnant 09/23/2013  . Supervision of normal first pregnancy 09/15/2013    Assessment: Debbie Liu is a 19 y.o. G1P0 at 6325w0d here for in active labor  #Labor:breast #Pain: Epidural upon request #FWB: Cat I #ID:  GBS neg #MOF:  breast #MOC:nexplanon #Circ:  Yes if boy, gender unknown  Debbie Liu Debbie Liu 04/17/2014, 11:01 AM

## 2014-04-18 ENCOUNTER — Encounter (HOSPITAL_COMMUNITY): Payer: Self-pay | Admitting: *Deleted

## 2014-04-18 ENCOUNTER — Inpatient Hospital Stay (HOSPITAL_COMMUNITY): Payer: Medicaid Other | Admitting: Anesthesiology

## 2014-04-18 DIAGNOSIS — Z3403 Encounter for supervision of normal first pregnancy, third trimester: Secondary | ICD-10-CM

## 2014-04-18 DIAGNOSIS — Z3A41 41 weeks gestation of pregnancy: Secondary | ICD-10-CM

## 2014-04-18 DIAGNOSIS — O480 Post-term pregnancy: Secondary | ICD-10-CM

## 2014-04-18 DIAGNOSIS — O1413 Severe pre-eclampsia, third trimester: Secondary | ICD-10-CM

## 2014-04-18 LAB — CBC
HCT: 33.2 % — ABNORMAL LOW (ref 36.0–46.0)
HCT: 30.7 % — ABNORMAL LOW (ref 36.0–46.0)
HEMOGLOBIN: 10.1 g/dL — AB (ref 12.0–15.0)
Hemoglobin: 11 g/dL — ABNORMAL LOW (ref 12.0–15.0)
MCH: 28.2 pg (ref 26.0–34.0)
MCH: 28.1 pg (ref 26.0–34.0)
MCHC: 33.1 g/dL (ref 30.0–36.0)
MCHC: 32.9 g/dL (ref 30.0–36.0)
MCV: 85.1 fL (ref 78.0–100.0)
MCV: 85.5 fL (ref 78.0–100.0)
Platelets: 220 10*3/uL (ref 150–400)
Platelets: 204 10*3/uL (ref 150–400)
RBC: 3.9 MIL/uL (ref 3.87–5.11)
RBC: 3.59 MIL/uL — ABNORMAL LOW (ref 3.87–5.11)
RDW: 14.4 % (ref 11.5–15.5)
RDW: 14.5 % (ref 11.5–15.5)
WBC: 22.9 10*3/uL — AB (ref 4.0–10.5)
WBC: 23.1 10*3/uL — ABNORMAL HIGH (ref 4.0–10.5)

## 2014-04-18 LAB — RPR: RPR Ser Ql: NONREACTIVE

## 2014-04-18 LAB — HIV ANTIBODY (ROUTINE TESTING W REFLEX): HIV SCREEN 4TH GENERATION: NONREACTIVE

## 2014-04-18 LAB — MRSA PCR SCREENING: MRSA BY PCR: NEGATIVE

## 2014-04-18 MED ORDER — LANOLIN HYDROUS EX OINT: TOPICAL_OINTMENT | CUTANEOUS | Status: DC | PRN

## 2014-04-18 MED ORDER — SENNOSIDES-DOCUSATE SODIUM 8.6-50 MG PO TABS
2.0000 | ORAL_TABLET | ORAL | Status: DC
  Administered 2014-04-18 – 2014-04-19 (×2): 2 via ORAL
  Filled 2014-04-18 (×3): qty 2

## 2014-04-18 MED ORDER — MISOPROSTOL 200 MCG PO TABS
200.0000 ug | ORAL_TABLET | ORAL | Status: DC
  Administered 2014-04-18 – 2014-04-19 (×5): 200 ug via ORAL
  Filled 2014-04-18 (×5): qty 1

## 2014-04-18 MED ORDER — OXYTOCIN 40 UNITS IN LACTATED RINGERS INFUSION - SIMPLE MED
62.5000 mL/h | INTRAVENOUS | Status: DC | PRN
  Administered 2014-04-18: 62.5 mL/h via INTRAVENOUS
  Filled 2014-04-18: qty 1000

## 2014-04-18 MED ORDER — FENTANYL 2.5 MCG/ML BUPIVACAINE 1/10 % EPIDURAL INFUSION (WH - ANES)
12.0000 mL/h | INTRAMUSCULAR | Status: DC | PRN
  Administered 2014-04-18: 12 mL/h via EPIDURAL
  Filled 2014-04-18: qty 125

## 2014-04-18 MED ORDER — BENZOCAINE-MENTHOL 20-0.5 % EX AERO
1.0000 "application " | INHALATION_SPRAY | CUTANEOUS | Status: DC | PRN
  Filled 2014-04-18 (×2): qty 56

## 2014-04-18 MED ORDER — FENTANYL 2.5 MCG/ML BUPIVACAINE 1/10 % EPIDURAL INFUSION (WH - ANES)
INTRAMUSCULAR | Status: DC | PRN
Start: 2014-04-18 — End: 2014-04-18
  Administered 2014-04-18: 12 mL/h via EPIDURAL

## 2014-04-18 MED ORDER — ONDANSETRON HCL 4 MG/2ML IJ SOLN
4.0000 mg | INTRAMUSCULAR | Status: DC | PRN
Start: 2014-04-18 — End: 2014-04-20

## 2014-04-18 MED ORDER — ZOLPIDEM TARTRATE 5 MG PO TABS: 5.0000 mg | ORAL_TABLET | Freq: Every evening | ORAL | Status: DC | PRN

## 2014-04-18 MED ORDER — SIMETHICONE 80 MG PO CHEW: 80.0000 mg | CHEWABLE_TABLET | ORAL | Status: DC | PRN

## 2014-04-18 MED ORDER — LIDOCAINE HCL (PF) 1 % IJ SOLN
INTRAMUSCULAR | Status: DC | PRN
  Administered 2014-04-18: 6 mL
  Administered 2014-04-18: 4 mL

## 2014-04-18 MED ORDER — DIPHENHYDRAMINE HCL 25 MG PO CAPS: 25.0000 mg | ORAL_CAPSULE | Freq: Four times a day (QID) | ORAL | Status: DC | PRN

## 2014-04-18 MED ORDER — DIBUCAINE 1 % RE OINT
1.0000 "application " | TOPICAL_OINTMENT | RECTAL | Status: DC | PRN
  Filled 2014-04-18: qty 28

## 2014-04-18 MED ORDER — OXYCODONE-ACETAMINOPHEN 5-325 MG PO TABS: 1.0000 | ORAL_TABLET | ORAL | Status: DC | PRN

## 2014-04-18 MED ORDER — GENTAMICIN SULFATE 40 MG/ML IJ SOLN
150.0000 mg | Freq: Three times a day (TID) | INTRAVENOUS | Status: DC
  Filled 2014-04-18: qty 3.75

## 2014-04-18 MED ORDER — WITCH HAZEL-GLYCERIN EX PADS: 1.0000 "application " | MEDICATED_PAD | CUTANEOUS | Status: DC | PRN

## 2014-04-18 MED ORDER — GENTAMICIN SULFATE 40 MG/ML IJ SOLN
170.0000 mg | Freq: Once | INTRAVENOUS | Status: AC
  Administered 2014-04-18: 170 mg via INTRAVENOUS
  Filled 2014-04-18: qty 4.25

## 2014-04-18 MED ORDER — OXYCODONE-ACETAMINOPHEN 5-325 MG PO TABS: 2.0000 | ORAL_TABLET | ORAL | Status: DC | PRN

## 2014-04-18 MED ORDER — SODIUM CHLORIDE 0.9 % IJ SOLN
3.0000 mL | Freq: Two times a day (BID) | INTRAMUSCULAR | Status: DC
  Administered 2014-04-19: 3 mL via INTRAVENOUS

## 2014-04-18 MED ORDER — TETANUS-DIPHTH-ACELL PERTUSSIS 5-2.5-18.5 LF-MCG/0.5 IM SUSP
0.5000 mL | Freq: Once | INTRAMUSCULAR | Status: DC
  Filled 2014-04-18: qty 0.5

## 2014-04-18 MED ORDER — IBUPROFEN 600 MG PO TABS
600.0000 mg | ORAL_TABLET | Freq: Four times a day (QID) | ORAL | Status: DC
  Administered 2014-04-18 – 2014-04-20 (×10): 600 mg via ORAL
  Filled 2014-04-18 (×10): qty 1

## 2014-04-18 MED ORDER — PRENATAL MULTIVITAMIN CH
1.0000 | ORAL_TABLET | Freq: Every day | ORAL | Status: DC
  Administered 2014-04-19 – 2014-04-20 (×2): 1 via ORAL
  Filled 2014-04-18 (×2): qty 1

## 2014-04-18 MED ORDER — SODIUM CHLORIDE 0.9 % IV SOLN
2.0000 g | Freq: Four times a day (QID) | INTRAVENOUS | Status: DC
  Administered 2014-04-18: 2 g via INTRAVENOUS
  Filled 2014-04-18 (×2): qty 2000

## 2014-04-18 MED ORDER — ONDANSETRON HCL 4 MG PO TABS: 4.0000 mg | ORAL_TABLET | ORAL | Status: DC | PRN

## 2014-04-18 MED ORDER — SODIUM CHLORIDE 0.9 % IV SOLN: 250.0000 mL | INTRAVENOUS | Status: DC | PRN

## 2014-04-18 MED ORDER — SODIUM CHLORIDE 0.9 % IJ SOLN
3.0000 mL | INTRAMUSCULAR | Status: DC | PRN
  Administered 2014-04-19: 3 mL via INTRAVENOUS
  Filled 2014-04-18: qty 3

## 2014-04-18 NOTE — Progress Notes (Signed)
Will allow to labor down.

## 2014-04-18 NOTE — Progress Notes (Signed)
15 ml of fentanyl wasted in sink on 04/18/14 at 0124 with Venetia Constableassie McIntosh, RN as a witness.

## 2014-04-18 NOTE — Progress Notes (Signed)
42 CC Fentanyl PCA syringe wasted in sink by Cammy CopaJennifer Maya Scholer, RN and Dan Europehristy Wicker, [place] @ [phone] on 04/18/14

## 2014-04-18 NOTE — Progress Notes (Signed)
I & O cath for 75 cc.

## 2014-04-18 NOTE — Progress Notes (Signed)
This note also relates to the following rows which could not be included: Pulse Rate - Cannot attach notes to unvalidated device data SpO2 - Cannot attach notes to unvalidated device data   Small lip remains, attempted to push past with no success.  Talked with patient and family about need to be able to turn patient to assist with resolving lip.  Patient is very uncomfortable when side-lying.  Discussed epidural and ability to decrease pain and allow patient an easier time to change position.

## 2014-04-18 NOTE — Anesthesia Preprocedure Evaluation (Addendum)
Anesthesia Evaluation  Patient identified by MRN, date of birth, ID band Patient awake    Reviewed: Allergy & Precautions, NPO status , Patient's Chart, lab work & pertinent test results  History of Anesthesia Complications Negative for: history of anesthetic complications  Airway Mallampati: III  TM Distance: >3 FB Neck ROM: Full    Dental no notable dental hx.    Pulmonary neg pulmonary ROS,  breath sounds clear to auscultation  Pulmonary exam normal       Cardiovascular hypertension, negative cardio ROS  Rhythm:Regular Rate:Normal  Pre-eclampsia, on Mg   Neuro/Psych negative neurological ROS  negative psych ROS   GI/Hepatic negative GI ROS, Neg liver ROS, GERD-  ,  Endo/Other  negative endocrine ROS  Renal/GU negative Renal ROS  negative genitourinary   Musculoskeletal negative musculoskeletal ROS (+)   Abdominal Normal abdominal exam  (+)   Peds negative pediatric ROS (+)  Hematology negative hematology ROS (+)   Anesthesia Other Findings Tonsillectomy as a child, no problems w/ GA  Reproductive/Obstetrics negative OB ROS                           Anesthesia Physical Anesthesia Plan  ASA: III  Anesthesia Plan: Epidural   Post-op Pain Management:    Induction:   Airway Management Planned:   Additional Equipment:   Intra-op Plan:   Post-operative Plan:   Informed Consent:   Plan Discussed with:   Anesthesia Plan Comments: 39(19 yr old G1 at 6041 and 1 day  here in labor with preeclampsia. Plt 220. BP most recently 140s/80s.)      Anesthesia Quick Evaluation

## 2014-04-18 NOTE — Progress Notes (Signed)
Labor Progress Note  S: very uncomfortable  O:  BP 128/72 mmHg  Pulse 114  Temp(Src) 98.7 F (37.1 C) (Oral)  Resp 18  Ht 5\' 4"  (1.626 m)  Wt 175 lb 8 oz (79.606 kg)  BMI 30.11 kg/m2  SpO2 97%  LMP 07/20/2013  0500 10/100/+2, ROP 0700 no change now with some caput, no movement when pushing but pelvis adequate  A&P: 19 y.o. G1P0 6569w1d with preeclampsia with severe fx on mag #preE: continue mag #pain: comfortable with epidural #labor: continue pit, continue pushing.  Advised that may need c/s give minimal movement. # chorio: continue abx  Joncarlo Friberg ROCIO, MD 7:23 AM

## 2014-04-18 NOTE — Progress Notes (Signed)
Labor Progress Note  S: very uncomfortable, now agreeable to epidural  O:  BP 121/88 mmHg  Pulse 132  Temp(Src) 98.7 F (37.1 C) (Oral)  Resp 18  Ht 5\' 4"  (1.626 m)  Wt 175 lb 8 oz (79.606 kg)  BMI 30.11 kg/m2  SpO2 98%  LMP 07/20/2013  CVE: 10/100/+2, ROP  A&P: 19 y.o. G1P0 5234w1d with preeclampsia with severe fx on mag #preE: continue mag #pain: comfortable with epidural #labor: attempted to rotate, rotated from ROP to ROT => exaggerated sims, labor down x 30min # tachycardic, baby with mildly increased baseline, on exam patient is very warm although afebrile => start abx for chorio  Hiawatha Dressel ROCIO, MD 5:08 AM

## 2014-04-18 NOTE — Progress Notes (Signed)
Discussed with patient position changes.  However, patient wants to remain in supine to semi-fowlers.  Moving into side lying causes contraction pain to worsen.

## 2014-04-18 NOTE — Anesthesia Procedure Notes (Signed)
Epidural  Start time: 04/18/2014 2:32 AM End time: 04/18/2014 2:45 AM  Staffing Anesthesiologist: Rosario JacksPOTISEK, Carlitos Bottino GUFFEY Performed by: anesthesiologist   Preanesthetic Checklist Completed: patient identified, pre-op evaluation, timeout performed, IV checked, risks and benefits discussed and monitors and equipment checked  Epidural Patient position: sitting Prep: ChloraPrep Patient monitoring: blood pressure Approach: midline Location: L3-L4 Injection technique: LOR saline  Needle:  Needle type: Tuohy  Needle gauge: 17 G Needle length: 9 cm Needle insertion depth: 6 cm Catheter size: 19 Gauge Catheter at skin depth: 12 cm Test dose: negative  Assessment Events: blood not aspirated, injection not painful, no injection resistance, negative IV test and no paresthesia  Additional Notes Informed consent obtained prior to proceeding including risk of failure, 1% risk of PDPH, risk of minor discomfort and bruising.  Discussed rare but serious complications including epidural abscess, permanent nerve injury, epidural hematoma.  Discussed alternatives to epidural analgesia and patient desires to proceed.  Timeout performed pre-procedure verifying patient name, procedure, and platelet count.  Patient tolerated procedure well.  Catheter secured at 12 at the skin.  SA test negative as confirmed by no motor block of hip abduction at 5 min post injection of 50 mg of lidocaine into the epidural catheter.  Bupivacaine 0.1% with fentanyl 2.515mcg/ml infused post procedure at 3412ml/hr with PCEA of 9ml every 10 mins.

## 2014-04-18 NOTE — Progress Notes (Signed)
This note also relates to the following rows which could not be included: Pulse Rate - Cannot attach notes to unvalidated device data SpO2 - Cannot attach notes to unvalidated device data   Monitors off for epidural placement. 

## 2014-04-18 NOTE — Progress Notes (Addendum)
42cc fentanyl PCA syringe WIS by Sheffield Sliderhristy Wicker,RNC and [place] MaJennifer Lineberry,RN on 04/18/14@0339 

## 2014-04-18 NOTE — Progress Notes (Signed)
ANTIBIOTIC CONSULT NOTE - INITIAL  Pharmacy Consult for gentamicin Indication: maternal fever - R/O chorioamnionitis  No Known Allergies  Patient Measurements: Height: 5\' 4"  (162.6 cm) Weight: 175 lb 8 oz (79.606 kg) IBW/kg (Calculated) : 54.7 Adjusted Body Weight: 62 kg  Vital Signs: Temp: 98.7 F (37.1 C) (03/06 0430) Temp Source: Oral (03/06 0430) BP: 121/88 mmHg (03/06 0446) Pulse Rate: 132 (03/06 0446) Intake/Output from previous day: 03/05 0701 - 03/06 0700 In: 3366.1 [P.O.:690; I.V.:2676.1] Out: 225 [Urine:225] Intake/Output from this shift: Total I/O In: 3366.1 [P.O.:690; I.V.:2676.1] Out: 225 [Urine:225]  Labs:  Recent Labs  04/17/14 1110 04/17/14 1445 04/17/14 1512 04/18/14 0148  WBC 17.7*  --   --  22.9*  HGB 11.3*  --   --  11.0*  PLT 210  --   --  220  LABCREA  --  187.00  --   --   CREATININE  --   --  0.76  --    Estimated Creatinine Clearance: 113.6 mL/min (by C-G formula based on Cr of 0.76). No results for input(s): VANCOTROUGH, VANCOPEAK, VANCORANDOM, GENTTROUGH, GENTPEAK, GENTRANDOM, TOBRATROUGH, TOBRAPEAK, TOBRARND, AMIKACINPEAK, AMIKACINTROU, AMIKACIN in the last 72 hours.   Microbiology: No results found for this or any previous visit (from the past 720 hour(s)).  Medical History: Past Medical History  Diagnosis Date  . Medical history non-contributory   . Infection     UTI    Medications:  Scheduled:  . ampicillin (OMNIPEN) IV  2 g Intravenous 4 times per day  . gentamicin  150 mg Intravenous Q8H  . gentamicin  170 mg Intravenous Once  . lactated ringers  500 mL Intravenous Once   Infusions:  . fentaNYL 2.5 mcg/ml w/bupivacaine 1/10% in NS 100ml epidural infusion (125ml total)    . lactated ringers 75 mL/hr at 04/18/14 0124  . magnesium sulfate 2 g/hr (04/17/14 2203)  . oxytocin 40 units in LR 1000 mL    . oxytocin 40 units in LR 1000 mL    . oxytocin 40 units in LR 1000 mL 8 milli-units/min (04/18/14 0402)   PRN:  acetaminophen, citric acid-sodium citrate, [DISCONTINUED] diphenhydrAMINE **OR** diphenhydrAMINE, diphenhydrAMINE, ePHEDrine, ePHEDrine, fentaNYL, fentaNYL 2.5 mcg/ml w/bupivacaine 1/10% in NS 100ml epidural infusion (125ml total), lactated ringers, lidocaine (PF), naloxone **AND** sodium chloride, ondansetron, oxyCODONE-acetaminophen, oxyCODONE-acetaminophen, phenylephrine, phenylephrine   Assessment: [age]yr female 41wk gest G1P0 ROM - now with fever, to be treated with ampicillin 2gm IV q6h & gentamicin  Goal of Therapy:  Desire gentamicin serum peak level 6-528mcg/ml and trough <151mcg/ml  Plan:  1.  Gentamicin 170mg  IV x 1 dose, then 2. (if continued) gentamicin 150mg  IV q8h 3.  Monitor for indices of infection and renal function 4.  Measure actual serum gentamicin levels at steady state as clinically apporpriate  Debbie Liu, Debbie Liu E 04/18/2014,5:22 AM

## 2014-04-18 NOTE — Progress Notes (Signed)
Pushing with rope 

## 2014-04-18 NOTE — Progress Notes (Signed)
Baird Cancerabitha Grossi is a 19 y.o. G1P0 at 2172w1d by ultrasound admitted for induction of labor due to Pre-eclamptic toxemia of pregnancy..  Subjective:   Objective: BP 127/82 mmHg  Pulse 98  Temp(Src) 99.1 F (37.3 C) (Oral)  Resp 18  Ht 5\' 4"  (1.626 m)  Wt 175 lb 8 oz (79.606 kg)  BMI 30.11 kg/m2  SpO2 97%  LMP 07/20/2013 I/O last 3 completed shifts: In: 4136.3 [P.O.:1020; I.V.:3012; IV Piggyback:104.3] Out: 225 [Urine:225] Total I/O In: 449.6 [P.O.:170; I.V.:279.6] Out: 200 [Emesis/NG output:200]  FHT:  FHR: 160 bpm, variability: minimal ,  accelerations:  Abscent,  decelerations:  Absent UC:   regular, every 3-7 minutes SVE:   Dilation: 10 Effacement (%): 100 Station: +2 (caput/perineal swelling, ) Exam by:: Cammy CopaJennifer Lineberry, RN  Labs: Lab Results  Component Value Date   WBC 22.9* 04/18/2014   HGB 11.0* 04/18/2014   HCT 33.2* 04/18/2014   MCV 85.1 04/18/2014   PLT 220 04/18/2014    Assessment / Plan: Induction of labor due to preeclampsia,  progressing well on pitocin  Labor: Progressing on Pitocin, will continue to increase then AROM Preeclampsia:  on magnesium sulfate Fetal Wellbeing:  Category I Pain Control:  Epidural I/D:  n/a Anticipated MOD:  NSVD  Asaf Elmquist DARLENE 04/18/2014, 9:43 AM

## 2014-04-18 NOTE — Progress Notes (Signed)
This note also relates to the following rows which could not be included: SpO2 - Cannot attach notes to unvalidated device data   Monitors reapplied.

## 2014-04-18 NOTE — Progress Notes (Signed)
15 ml of fentanyl wasted in sink on 04/18/14 at 0124. Witnessed by Verne GrainAshley Tuttle RN

## 2014-04-18 NOTE — Progress Notes (Signed)
Labor Progress Note  S: very uncomfortable, now agreeable to epidural  O:  BP 123/64 mmHg  Pulse 87  Temp(Src) 98.4 F (36.9 C) (Oral)  Resp 18  Ht 5\' 4"  (1.626 m)  Wt 175 lb 8 oz (79.606 kg)  BMI 30.11 kg/m2  SpO2 98%  LMP 07/20/2013  Dilation: Lip/rim Effacement (%): 100 Cervical Position: Anterior Station: +1, +2 Presentation: Vertex Exam by:: Cammy CopaJennifer Lineberry, RN   A&P: 19 y.o. G1P0 3555w1d with preeclampsia with severe fx on mag #preE: continue mag #pain: stop fentanyl PCA, epidural pending #labor: once epidural in place and pt comfortable will reexamine  Perry MountACOSTA,Febe Champa ROCIO, MD 4:37 AM

## 2014-04-19 ENCOUNTER — Inpatient Hospital Stay (HOSPITAL_COMMUNITY): Admission: RE | Admit: 2014-04-19 | Payer: Medicaid Other | Source: Ambulatory Visit

## 2014-04-19 LAB — COMPREHENSIVE METABOLIC PANEL
ALK PHOS: 127 U/L — AB (ref 39–117)
ALT: 15 U/L (ref 0–35)
AST: 32 U/L (ref 0–37)
Albumin: 2 g/dL — ABNORMAL LOW (ref 3.5–5.2)
Anion gap: 7 (ref 5–15)
BUN: 16 mg/dL (ref 6–23)
CO2: 24 mmol/L (ref 19–32)
Calcium: 7.6 mg/dL — ABNORMAL LOW (ref 8.4–10.5)
Chloride: 105 mmol/L (ref 96–112)
Creatinine, Ser: 1 mg/dL (ref 0.50–1.10)
GFR calc Af Amer: >90 mL/min (ref >90)
GFR calc non Af Amer: 80 mL/min — ABNORMAL LOW (ref >90)
Glucose, Bld: 100 mg/dL — ABNORMAL HIGH (ref 70–99)
POTASSIUM: 4.3 mmol/L (ref 3.5–5.1)
SODIUM: 136 mmol/L (ref 135–145)
TOTAL PROTEIN: 4.9 g/dL — AB (ref 6.0–8.3)
Total Bilirubin: 0.4 mg/dL (ref 0.3–1.2)

## 2014-04-19 LAB — CBC
HEMATOCRIT: 24.4 % — AB (ref 36.0–46.0)
Hemoglobin: 8.1 g/dL — ABNORMAL LOW (ref 12.0–15.0)
MCH: 28 pg (ref 26.0–34.0)
MCHC: 33.2 g/dL (ref 30.0–36.0)
MCV: 84.4 fL (ref 78.0–100.0)
Platelets: 234 10*3/uL (ref 150–400)
RBC: 2.89 MIL/uL — ABNORMAL LOW (ref 3.87–5.11)
RDW: 14.7 % (ref 11.5–15.5)
WBC: 18.5 10*3/uL — ABNORMAL HIGH (ref 4.0–10.5)

## 2014-04-19 MED ORDER — LACTATED RINGERS IV SOLN
INTRAVENOUS | Status: DC
  Administered 2014-04-19: 08:00:00 via INTRAVENOUS

## 2014-04-19 MED ORDER — MEASLES, MUMPS & RUBELLA VAC ~~LOC~~ INJ
0.5000 mL | INJECTION | Freq: Once | SUBCUTANEOUS | Status: DC
Start: 2014-04-20 — End: 2014-04-20
  Filled 2014-04-19: qty 0.5

## 2014-04-19 NOTE — Plan of Care (Signed)
Problem: Phase II Progression Outcomes Goal: Pain controlled on oral analgesia Outcome: Completed/Met Date Met:  04/19/14 Pain controlled on po Motrin at this time. Goal: Tolerating diet Outcome: Completed/Met Date Met:  04/19/14 Tolerating a Regular diet well.

## 2014-04-19 NOTE — Lactation Note (Signed)
This note was copied from the chart of Debbie Liu. Lactation Consultation Note  Follow up visit with mom in the NICU.  Symphony pump set up at bedside and initiated.  Mom states she has been leaking milk and was able to fill a colostrum container by using hand expression yesterday.  Mom pumped for 10 minutes and filled 5 colostrum containers.  Instructed mom to pump every 3 hours for 15 minutes followed by hand expression.  [facility] for use post discharge.  Encouraged to call for concerns/assist prn.  Patient Name: Debbie Liu's Date: 04/19/2014 Reason for consult: NICU baby;Follow-up assessment   Maternal Data    Feeding    LATCH Score/Interventions                      Lactation Tools Discussed/Used Pump Review: Setup, frequency, and cleaning;Milk Storage Initiated by:: [name] RN, IBCLC Date initiated:: 04/19/14   Consult Status Date: 04/20/14 Follow-up type: In-patient    Huston FoleyMOULDEN, Wynne Jury S 04/19/2014, 11:25 AM

## 2014-04-19 NOTE — Progress Notes (Signed)
Post Partum Day `1  Subjective: no complaints and tolerating PO  Objective: Blood pressure 139/83, pulse 103, temperature 99.2 F (37.3 C), temperature source Oral, resp. rate 18, height 5\' 4"  (1.626 m), weight 173 lb 3 oz (78.557 kg), last menstrual period 07/20/2013, SpO2 99 %, unknown if currently breastfeeding.  Physical Exam:  General: alert, cooperative, appears stated age, no distress and out put good Lochia: appropriate Uterine Fundus: firm Incision: healing well, no significant drainage, no dehiscence DVT Evaluation: No evidence of DVT seen on physical exam. Negative Homan's sign. No cords or calf tenderness. No significant calf/ankle edema.   Recent Labs  04/18/14 1119 04/19/14 0520  HGB 10.1* 8.1*  HCT 30.7* 24.4*    Assessment/Plan: Plan for discharge tomorrow   LOS: 2 days   LAWSON, MARIE DARLENE 04/19/2014, 7:20 AM

## 2014-04-19 NOTE — Anesthesia Postprocedure Evaluation (Signed)
  Anesthesia Post-op Note  Patient: Debbie Liu  Procedure(s) Performed: * No procedures listed *  Patient Location: PACU and A-ICU  Anesthesia Type:Epidural  Level of Consciousness: awake, alert  and oriented  Airway and Oxygen Therapy: Patient Spontanous Breathing  Post-op Pain: mild  Post-op Assessment: Post-op Vital signs reviewed, Patient's Cardiovascular Status Stable, Respiratory Function Stable, No signs of Nausea or vomiting, Adequate PO intake, Pain level controlled, No headache, No backache, No residual numbness and No residual motor weakness  Post-op Vital Signs: Reviewed and stable  Last Vitals:  Filed Vitals:   04/19/14 0700  BP: 139/83  Pulse: 103  Temp:   Resp: 18    Complications: No apparent anesthesia complications

## 2014-04-20 MED ORDER — OXYCODONE-ACETAMINOPHEN 5-325 MG PO TABS: 1.0000 | ORAL_TABLET | ORAL | Status: DC | PRN

## 2014-04-20 MED ORDER — IBUPROFEN 600 MG PO TABS: 600.0000 mg | ORAL_TABLET | Freq: Four times a day (QID) | ORAL | Status: AC

## 2014-04-20 NOTE — Progress Notes (Signed)
Pt ambulated to nicu   To room in with baby

## 2014-04-20 NOTE — Lactation Note (Signed)
This note was copied from the chart of Debbie Liu. Lactation Consultation Note  Mom is currently  in NICU with baby.  FOB states that they will room in with baby tonight.  I will follow up to assist with pumping and or latching baby as needed.  Patient Name: Debbie Liu EAVWU'JToday's Date: 04/20/2014     Maternal Data    Feeding Feeding Type: Formula Nipple Type: Slow - flow Length of feed: 10 min  LATCH Score/Interventions                      Lactation Tools Discussed/Used     Consult Status      Huston FoleyMOULDEN, Jusitn Salsgiver S 04/20/2014, 12:54 PM

## 2014-04-20 NOTE — Discharge Summary (Addendum)
Obstetric Discharge Summary Reason for Admission: induction of labor Prenatal Procedures: Preeclampsia Intrapartum Procedures: vacuum Postpartum Procedures: none Complications-Operative and Postpartum:   Operative Delivery Note At 10:36 AM a viable female was delivered via . Presentation: vertex; Position: Right,, Occiput,, Anterior; Station: +3.  Verbal consent: obtained from patient. Risks and benefits discussed in detail. Risks include, but are not limited to the risks of anesthesia, bleeding, infection, damage to maternal tissues, fetal cephalhematoma. There is also the risk of inability to effect vaginal delivery of the head, or shoulder dystocia that cannot be resolved by established maneuvers, leading to the need for emergency cesarean section.  APGAR:to be assigned ; weight .  Placenta status:spont via shultz , .  Cord: 3 vessels with the following complications: . Cord pH: 7.1  Anesthesia: Epidural  Instruments: kiwi Episiotomy:ml Lacerations: none  Suture Repair: 3.0 vicryl Est. Blood Loss150 (mL):   Mom to AICU. Baby to Couplet care / Skin to Skin.  Debbie Liu, Debbie Liu 04/18/2014, 11:00 AM  HEMOGLOBIN  Date Value Ref Range Status  04/19/2014 8.1* 12.0 - 15.0 g/dL Final    Comment:    DELTA CHECK NOTED REPEATED TO VERIFY    HCT  Date Value Ref Range Status  04/19/2014 24.4* 36.0 - 46.0 % Final   Filed Vitals:   04/20/14 0535  BP: 117/65  Pulse: 65  Temp: 97.7 F (36.5 C)  Resp: 15    Physical Exam:  General: alert, cooperative and no distress Lochia: appropriate Uterine Fundus: firm Incision:   DVT Evaluation: No evidence of DVT seen on physical exam.  Discharge Diagnoses: Term Pregnancy-delivered and Preelampsia  Discharge Information: Date: 04/20/2014 Activity: pelvic rest and no heavy lifting Diet: routine Medications: ibuprofen Condition: stable Instructions: refer to practice specific booklet Discharge to: home Follow-up  Information    Follow up with [facility] In 6 weeks.   Contact information:   [address] [place] [phone]      Newborn Data: Live born female  Birth Weight: 8 lb 8.3 oz (3864 g) APGAR: 6, 6  Home with mother.  Debbie Liu 04/20/2014, 7:44 AM

## 2014-04-20 NOTE — Progress Notes (Signed)
CSW met briefly with FOB in MOB's third floor room to check in and offer support in relation to NICU admission and to see if they have needs prior to potential d/c tomorrow.  He informed CSW that MOB was in NICU with baby.  He states they are doing well, have all supplies necessary for baby and have a good support system.  He states no questions, concerns or needs.  CSW left contact information and asked that he or MOB call if questions or needs arise.  He thanked CSW.  CSW completed review of MOB's PNR.  CSW notes no social concerns other than hx of marijuana use, which is clearly documented as prior to pregnancy.  CSW identifies no barriers to discharge.

## 2014-04-20 NOTE — Discharge Instructions (Signed)

## 2014-04-21 ENCOUNTER — Ambulatory Visit: Payer: Self-pay

## 2014-04-21 NOTE — Lactation Note (Signed)
This note was copied from the chart of Debbie Jaynie Marner. Lactation Consultation Note   Mom roomed in with baby in NICU last night, and they are taking the baby home today. The baby is breast and bottle at this time, with mom's goal to breast feed. Mom is 3 days post partum. I  had her pump in standard setting, and she expressed 45 mls of transitionl  milk. I decreased her to 21 flanges with a ggod and comfortable fit. I made her an o/p lactation appointment for 3/15 at 9 am. I advised mom to keep pumping every 3 hours , unless she finds her breast softening well with breast feeding. Mom knows to call frorquestions/concerns.  Patient Name: Debbie Liu ZOXWR'UToday's Date: 04/21/2014 Reason for consult: Follow-up assessment   Maternal Data    Feeding Feeding Type: Formula Nipple Type: Slow - flow  LATCH Score/Interventions                      Lactation Tools Discussed/Used     Consult Status Consult Status: Complete Date: 04/27/14 Follow-up type: Out-patient    Alfred LevinsLee, Iyanah Demont Anne 04/21/2014, 12:06 PM

## 2014-04-27 ENCOUNTER — Ambulatory Visit (HOSPITAL_COMMUNITY): Payer: Medicaid Other

## 2014-04-29 ENCOUNTER — Ambulatory Visit (HOSPITAL_COMMUNITY)
Admission: RE | Admit: 2014-04-29 | Discharge: 2014-04-29 | Disposition: A | Discharge status: Home / Self Care | Payer: Medicaid Other | Source: Ambulatory Visit | Attending: Obstetrics and Gynecology | Admitting: Obstetrics and Gynecology

## 2014-04-29 NOTE — Lactation Note (Signed)
Lactation Consult  Mother's reason for visit:  Feeding check , pre - schedule  Visit Type:  Feeding assessment  Appointment Notes: confirmed  Consult:  Initial Lactation Consultant:  Debbie Liu, Debbie Liu  ________________________________________________________________________ Debbie FloresBaby's Name: Debbie Liu Date of Birth: [birth year]18/2016 Pediatrician: Dr. Fae PippinStephen Kearns  Gender: female Gestational Age: 9156w1d (At Birth) Birth Weight: 8 lb 8.3 oz (3864 g) Weight at Discharge: Weight: 8 lb 4.6 oz (3760 g)Date of Discharge: 04/21/2014 Filed Weights   04/19/14 0400 04/20/14 0230 04/20/14 1300  Weight: 8 lb 3.6 oz (3730 g) 8 lb 6.8 oz (3820 g) 8 lb 4.6 oz (3760 g)   [facility]  Location:Pediatrician's office Weight today:9-3.4 oz , 4180 g       ________________________________________________________________________  Mother's Name: Debbie Liu Type of delivery:  Vaginal Delivery  Breastfeeding Experience:  Per mom good so far  Maternal Medical Conditions:  Pregnancy induced hypertension Maternal Medications: none . LC suggested taking the PNV daily   ________________________________________________________________________  Breastfeeding History (Post Discharge)  Frequency of breastfeeding:  Per  Mom every 1 -2 hours  Duration of feeding:  20 -30 mins   Pumping: per mom maybe once a week and when necessary for fullness EBM - 3oz  Supplementing: Baby has occasional bottle ( ? Whether he likes the Medela nipple )    Infant Intake and Output Assessment  Voids:  10  in 24 hrs.  Color:  Clear yellow Stools:  10  in 24 hrs.  Color:  Yellow  ________________________________________________________________________  Maternal Breast Assessment  Breast:  Full and Asymmetrical Nipple:  Erect Pain level:  0 Pain interventions:  Expressed breast  milk  _______________________________________________________________________ Feeding Assessment/Evaluation  Initial feeding assessment:  Infant's oral assessment:  WNL  Positioning:  Cross cradle Left breast  LATCH documentation:  Latch:  2 = Grasps breast easily, tongue down, lips flanged, rhythmical sucking.  Audible swallowing:  2 = Spontaneous and intermittent  Type of nipple:  2 = Everted at rest and after stimulation  Comfort (Breast/Nipple):  1 = Filling, red/small blisters or bruises, mild/mod discomfort  Hold (Positioning):  1 = Assistance needed to correctly position infant at breast and maintain latch  LATCH score:  8   Attached assessment:  Shallow ( LC assisted with depth )   Lips flanged:  Yes.    Lips untucked:  Yes.    Suck assessment:  Nutritive  Tools:  Non tools for latching , instructed mom on the use hand pump  Instructed on use and cleaning of tool:  Yes.    Pre-feed weight:  4180g, 9-3.4 oz  Post-feed weight:  4298 g , 9-7.6 oz  Amount transferred:  118 ml  Amount supplemented:  None   Baby settled, got the hiccups , so LC suggested re - latching on the opposite breast  and baby ate 5 mins , hiccups were gone. Mom independent with latch and achieving depth with cross cradle position.   Total amount pumped post feed:  None needed   Total amount transferred:  118 ml  Total supplement given:  None    Lactation Impression : Mom and baby are doing great with breast feeding. LC made a point to praise mom for all her efforts . LC was very impressed for a 4911 day old baby to transfer 118 ml off the breast. Baby is gaining well and above birth weight . Mom is breast issue free, no plugged ducts , sore nipples or  engorgement )   Lactation Plan of care :  Lc recommended consider attending the [facility] at [facility] either Monday Evenings at 7 pm or Tuesday 11 am . Pamphlet given to mom. Feedings - every 2 1/2 - 3 hours and on demand , growth spurts 7-10 days , 3 week  , 6 weeks , etc. Cluster feeding normal  Always soften 1st breast well prior to offering the 2nd breast , also if to full to start , release breast small amount to get to Hindmilk quicker  If Debbie only feeds 1st breast , release 2nd breast down  Goal - protect milk supply  If giving a bottle - have to pump both breast for 15 -20 mins .

## 2014-05-28 ENCOUNTER — Encounter: Payer: Self-pay | Admitting: Obstetrics & Gynecology

## 2014-05-28 ENCOUNTER — Ambulatory Visit (INDEPENDENT_AMBULATORY_CARE_PROVIDER_SITE_OTHER): Payer: Medicaid Other | Admitting: Obstetrics & Gynecology

## 2014-05-28 NOTE — Progress Notes (Signed)
Patient ID: Debbie Liu, female   DOB: 10/24/1995, 19 y.o.   MRN: [id number] Subjective:     Debbie Liu is a 19 y.o. female who presents for a postpartum visit. She is 5 weeks postpartum following a spontaneous vaginal delivery. I have fully reviewed the prenatal and intrapartum course. The delivery was at 41 gestational weeks. Outcome: spontaneous vaginal delivery. Anesthesia: epidural. Postpartum course has been normal. Baby's course has been unremarkable. Baby is feeding by breast. Bleeding no bleeding. Bowel function is normal. Bladder function is normal. Patient is sexually active. Contraception method is condoms. Postpartum depression screening: negative.  The following portions of the patient's history were reviewed and updated as appropriate: allergies, current medications, past family history, past medical history, past social history, past surgical history and problem list.  Review of Systems Pertinent items are noted in HPI.   Objective:    BP 122/60 mmHg  Pulse 64  Ht 5\' 3"  (1.6 m)  Wt 142 lb (64.411 kg)  BMI 25.16 kg/m2  Breastfeeding? Yes  General:  alert, cooperative and no distress   Breasts:    Lungs:   Heart:    Abdomen: Soft non tender   Vulva:  normal  Vagina: normal vagina, no discharge, exudate, lesion, or erythema  Cervix:  no cervical motion tenderness and no lesions  Corpus: normal size, contour, position, consistency, mobility, non-tender  Adnexa:  normal adnexa  Rectal Exam:         Assessment:     Normal postpartum exam. Pap smear not done at today's visit.   Plan:    1. Contraception: desires nexplanon 2. Nexplanon ordered 3. Follow up in: 2 weeks or as needed.

## 2014-06-14 ENCOUNTER — Encounter: Payer: Medicaid Other | Admitting: Women's Health

## 2020-04-25 ENCOUNTER — Other Ambulatory Visit (HOSPITAL_COMMUNITY): Payer: Self-pay | Admitting: Nurse Practitioner

## 2020-04-25 DIAGNOSIS — N83209 Unspecified ovarian cyst, unspecified side: Secondary | ICD-10-CM

## 2020-05-02 ENCOUNTER — Ambulatory Visit (HOSPITAL_COMMUNITY)
Admission: RE | Admit: 2020-05-02 | Discharge: 2020-05-02 | Disposition: A | Discharge status: Home / Self Care | Payer: Medicaid Other | Source: Ambulatory Visit | Attending: Nurse Practitioner | Admitting: Nurse Practitioner

## 2020-05-02 DIAGNOSIS — N83209 Unspecified ovarian cyst, unspecified side: Secondary | ICD-10-CM | POA: Insufficient documentation

## 2021-06-04 ENCOUNTER — Other Ambulatory Visit: Payer: Self-pay

## 2021-06-04 ENCOUNTER — Emergency Department (HOSPITAL_COMMUNITY)
Admission: EM | Admit: 2021-06-04 | Discharge: 2021-06-04 | Disposition: A | Discharge status: Home / Self Care | Payer: Medicaid Other | Attending: Emergency Medicine | Admitting: Emergency Medicine

## 2021-06-04 DIAGNOSIS — L539 Erythematous condition, unspecified: Secondary | ICD-10-CM | POA: Insufficient documentation

## 2021-06-04 DIAGNOSIS — S4992XA Unspecified injury of left shoulder and upper arm, initial encounter: Secondary | ICD-10-CM | POA: Diagnosis present

## 2021-06-04 DIAGNOSIS — S40262A Insect bite (nonvenomous) of left shoulder, initial encounter: Secondary | ICD-10-CM | POA: Insufficient documentation

## 2021-06-04 DIAGNOSIS — R590 Localized enlarged lymph nodes: Secondary | ICD-10-CM | POA: Diagnosis not present

## 2021-06-04 DIAGNOSIS — W57XXXA Bitten or stung by nonvenomous insect and other nonvenomous arthropods, initial encounter: Secondary | ICD-10-CM | POA: Insufficient documentation

## 2021-06-04 LAB — POC URINE PREG, ED: Preg Test, Ur: NEGATIVE

## 2021-06-04 MED ORDER — DOXYCYCLINE HYCLATE 100 MG PO CAPS: 100.0000 mg | ORAL_CAPSULE | Freq: Two times a day (BID) | ORAL | 0 refills | Status: AC

## 2021-06-04 MED ORDER — DOXYCYCLINE HYCLATE 100 MG PO TABS
100.0000 mg | ORAL_TABLET | Freq: Once | ORAL | Status: AC
  Administered 2021-06-04: 100 mg via ORAL
  Filled 2021-06-04: qty 1

## 2021-06-04 NOTE — ED Provider Notes (Signed)
?[facility] EMERGENCY DEPARTMENT ?Provider Note ? ? ?CSN: [id number] ?Arrival date & time: 06/04/21  1213 ? ?  ? ?History ? ?Chief Complaint  ?Patient presents with  ? Insect Bite  ?  tick  ? ? ?Debbie Liu is a 26 y.o. female presenting for evaluation of tick bite which occurred 4 days ago.  She reports removing it fairly well embedded tick from her left posterior shoulder region 4 days ago after which she noticed a bull's-eye-like redness at the site 2 days later.  This redness resolved but is now more confluent redness at the site, slightly raised, also describing significant itching at the site.  She has also noticed a tender swollen lymph node in her left axilla which has her concerned about possible tick infection.  She denies fevers or chills, denies abdominal pain, myalgias, nausea or vomiting.  Denies headache neck stiffness.  She has had no treatments prior to arrival for this condition. ? ?The history is provided by the patient.  ? ?  ? ?Home Medications ?Prior to Admission medications   ?Medication Sig Start Date End Date Taking? Authorizing Provider  ?doxycycline (VIBRAMYCIN) 100 MG capsule Take 1 capsule (100 mg total) by mouth 2 (two) times daily for 14 days. 06/04/21 06/18/21 Yes Neva Ramaswamy, Raynelle Fanning, PA-C  ?ibuprofen (ADVIL,MOTRIN) 600 MG tablet Take 1 tablet (600 mg total) by mouth every 6 (six) hours. ?Patient not taking: Reported on 05/28/2014 04/20/14   Adam Phenix, MD  ?Prenatal Vit-Fe Fumarate-FA (PRENATAL MULTIVITAMIN) TABS tablet Take 1 tablet by mouth daily at 12 noon.    [provider]  ?   ? ?Allergies    ?Patient has no known allergies.   ? ?Review of Systems   ?Review of Systems  ?Constitutional:  Negative for chills and fever.  ?Respiratory:  Negative for shortness of breath and wheezing.   ?Gastrointestinal:  Negative for abdominal pain.  ?Musculoskeletal:  Negative for neck pain and neck stiffness.  ?Skin:  Positive for color change.  ?Neurological:  Negative for numbness and  headaches.  ? ?Physical Exam ?Updated Vital Signs ?BP 124/79 (BP Location: Left Arm)   Pulse (!) 57   Temp 97.8 ?F (36.6 ?C) (Oral)   Resp 16   Ht 5\' 3"  (1.6 m)   Wt 63.5 kg   SpO2 100%   BMI 24.80 kg/m?  ?Physical Exam ?Constitutional:   ?   General: She is not in acute distress. ?   Appearance: She is well-developed.  ?HENT:  ?   Head: Normocephalic.  ?Cardiovascular:  ?   Rate and Rhythm: Normal rate.  ?Pulmonary:  ?   Effort: Pulmonary effort is normal.  ?   Breath sounds: No wheezing.  ?Musculoskeletal:     ?   General: Normal range of motion.  ?   Cervical back: Neck supple.  ?Lymphadenopathy:  ?   Upper Body:  ?   Left upper body: Axillary adenopathy present.  ?Skin: ?   Findings: Erythema present.  ?   Comments: Slightly raised erythematous lesion left posterior shoulder, measuring 2 x 2 cm, no red streaking, no central clearance.  There is no obvious retained foreign body.  ? ? ?ED Results / Procedures / Treatments   ?Labs ?(all labs ordered are listed, but only abnormal results are displayed) ?Labs Reviewed  ?POC URINE PREG, ED - Normal  ? ? ?EKG ?None ? ?Radiology ?No results found. ? ?Procedures ?Procedures  ? ? ?Medications Ordered in ED ?Medications  ?doxycycline (VIBRA-TABS) tablet 100  mg (100 mg Oral Given 06/04/21 1257)  ? ? ?ED Course/ Medical Decision Making/ A&P ?  ?                        ?Medical Decision Making ?Patient who is now 4 days out from a removed tick from her left shoulder, having increasing redness and pruritus at the tick site which may be a simple localized reaction.  However she also has lymphadenopathy in her left axilla raising concern for possible tickborne infection.  4 days into a tick exposure, tick panel is unlikely to be helpful, deferred completing this test which patient agrees.  She will be placed on doxycycline however given the exposure and her current symptoms.  She was advised she needs to complete a full 2-week course of this medication.  She was given  return precautions and/or reasons to follow-up with her primary provider.  Parent follow-up anticipated. ? ?Risk ?Prescription drug management. ? ? ? ? ? ? ? ? ? ? ?Final Clinical Impression(s) / ED Diagnoses ?Final diagnoses:  ?Tick bite of left shoulder, initial encounter  ? ? ?Rx / DC Orders ?ED Discharge Orders   ? ?      Ordered  ?  doxycycline (VIBRAMYCIN) 100 MG capsule  2 times daily       ? 06/04/21 1252  ? ?  ?  ? ?  ? ? ?  ?Burgess Amor, PA-C ?06/04/21 1733 ? ?  ?Eber Hong, MD ?06/09/21 0940 ? ?

## 2021-06-04 NOTE — Discharge Instructions (Signed)
As discussed you are being covered for the possibility of a tick borne infection including [facility] spotted fever or Lyme disease.  Take the entire course of the antibiotic with your next dose this evening.  Be aware that this medicine can increase your sun sensitivity while you are taking, make sure you are protecting your skin from the sun while you are on this medication. ?

## 2021-06-04 NOTE — ED Triage Notes (Signed)
Pt had tick removed Wednesday. States Friday spot had bullseye around it that went away. Now pt has 2cm X 2cm red area on L shoulder. ?

## 2022-05-06 IMAGING — US US PELVIS COMPLETE WITH TRANSVAGINAL
1 series · 14 of 25 positions shown · non-contrast
Comparison: None

CLINICAL DATA: Intermittent right lower quadrant abdominal pain,
left ovarian cyst



[Series 1: us pelvic complete with transvaginal · 14 of 75 slices shown]
[im 1/75]
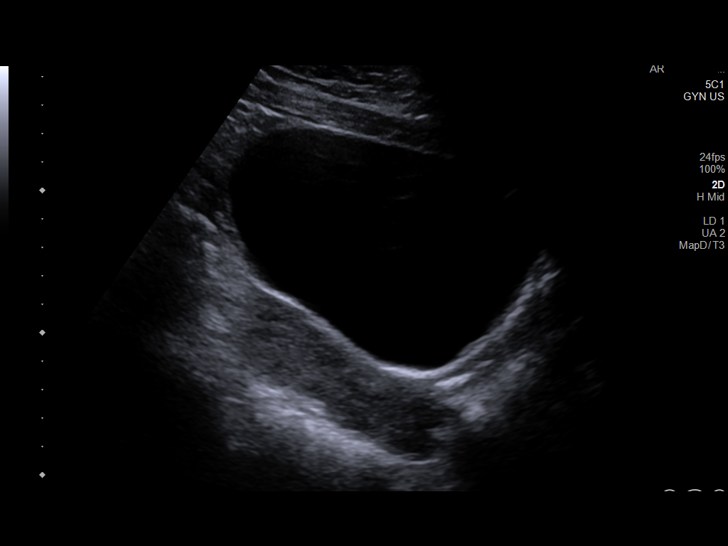
[im 7/75]
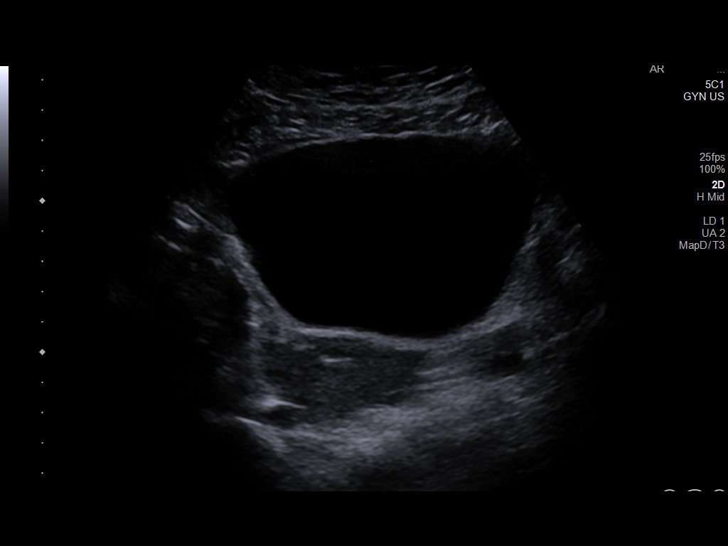
[im 13/75]
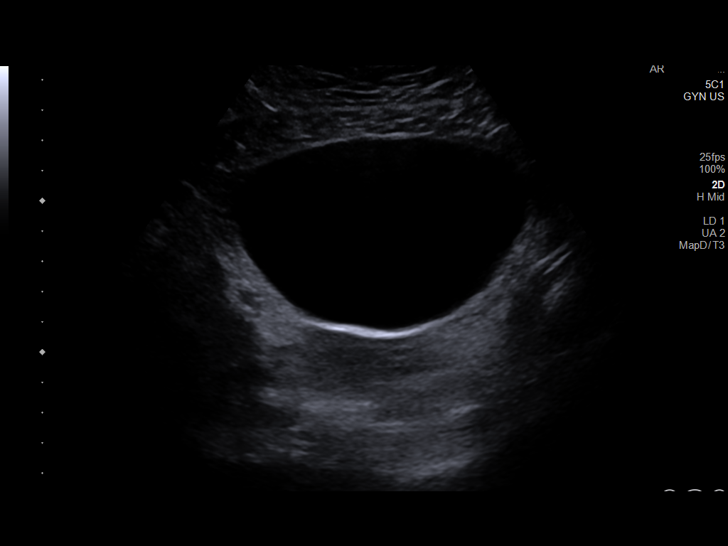
[im 19/75]
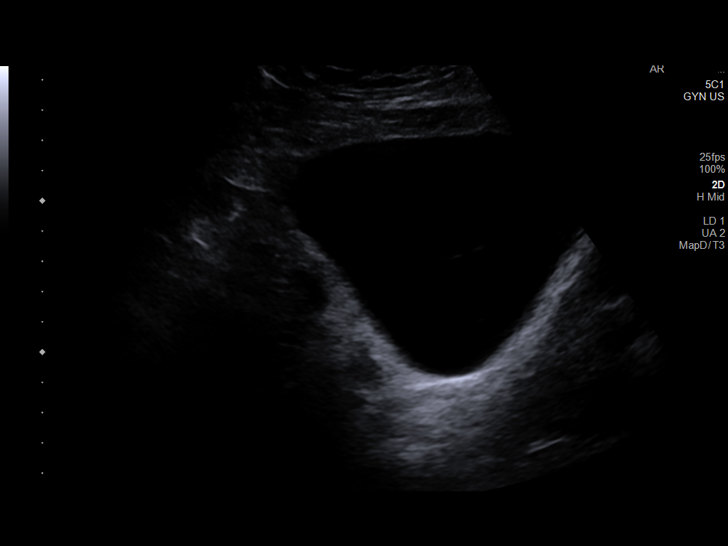
[im 25/75]
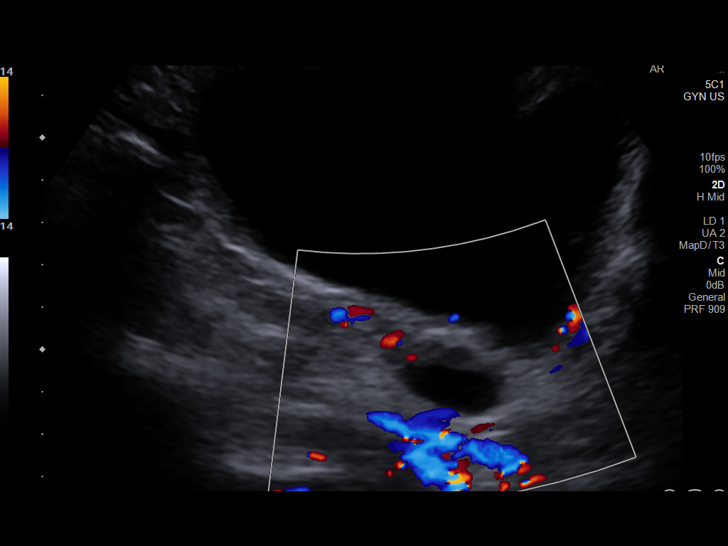
[im 28/75]
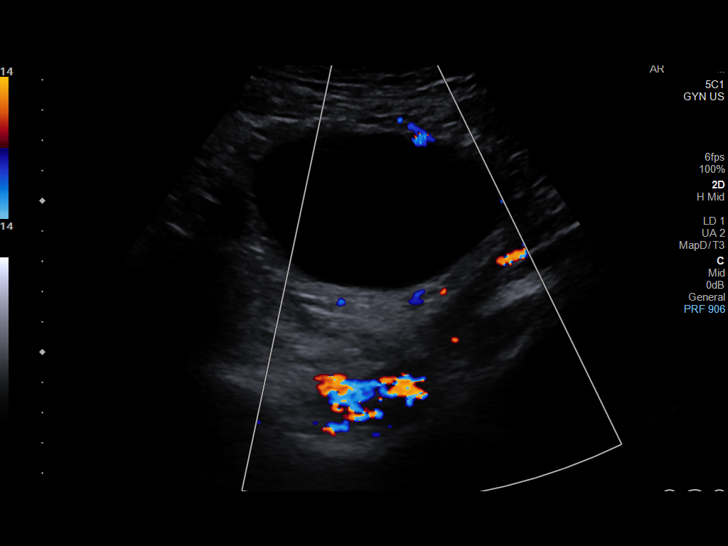
[im 34/75]
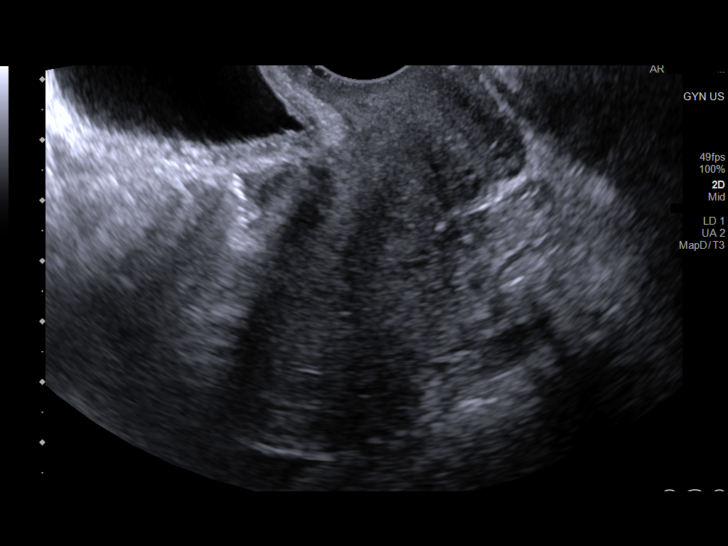
[im 41/75]
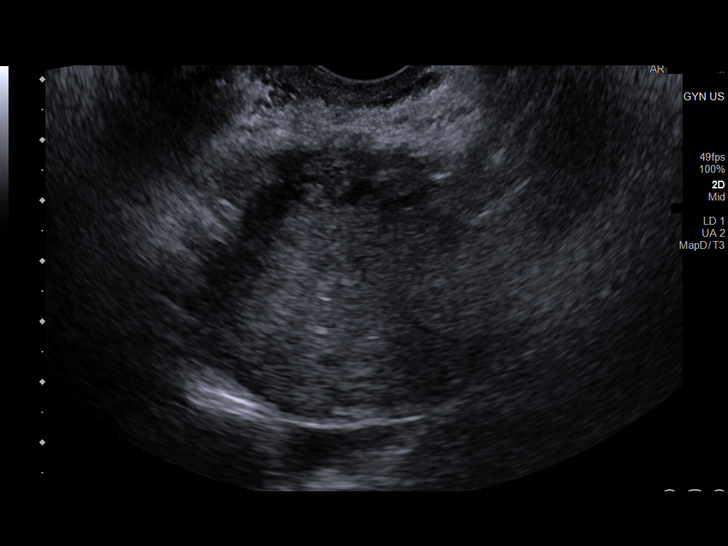
[im 47/75]
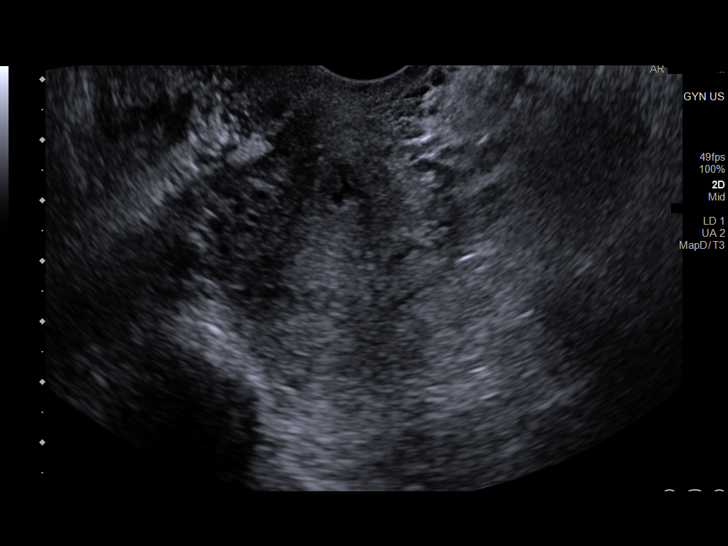
[im 50/75]
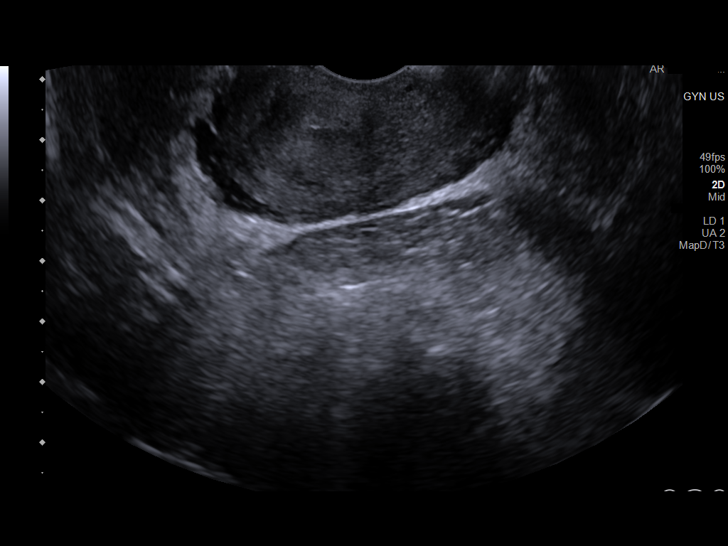
[im 56/75]
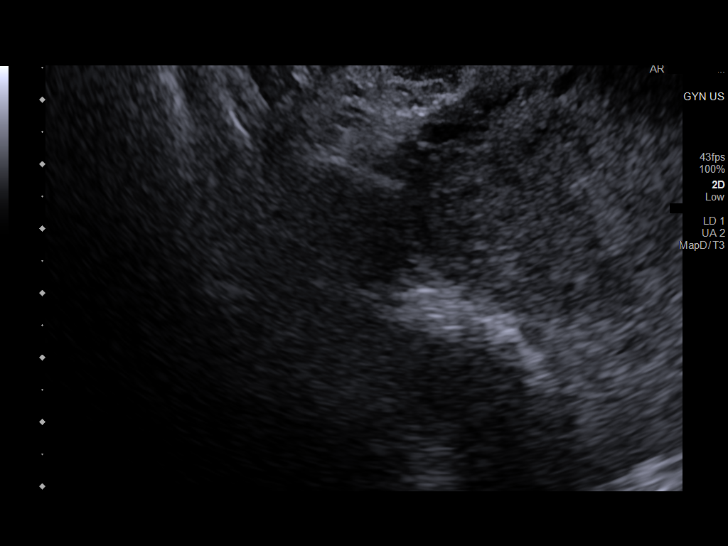
[im 62/75]
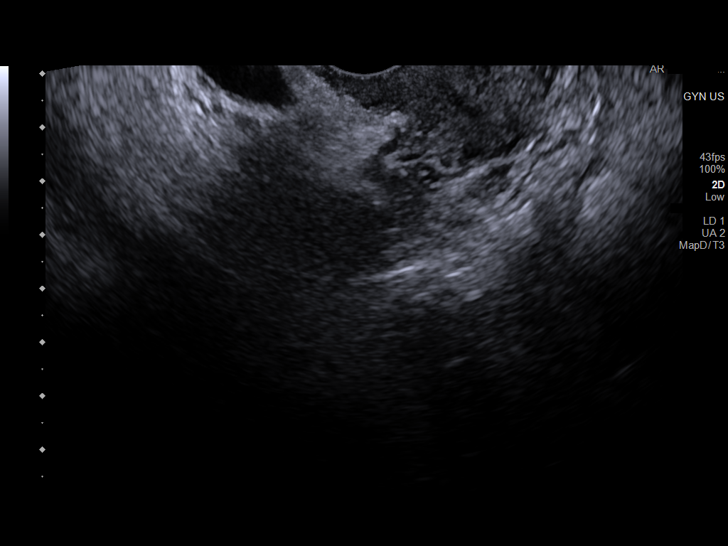
[im 68/75]
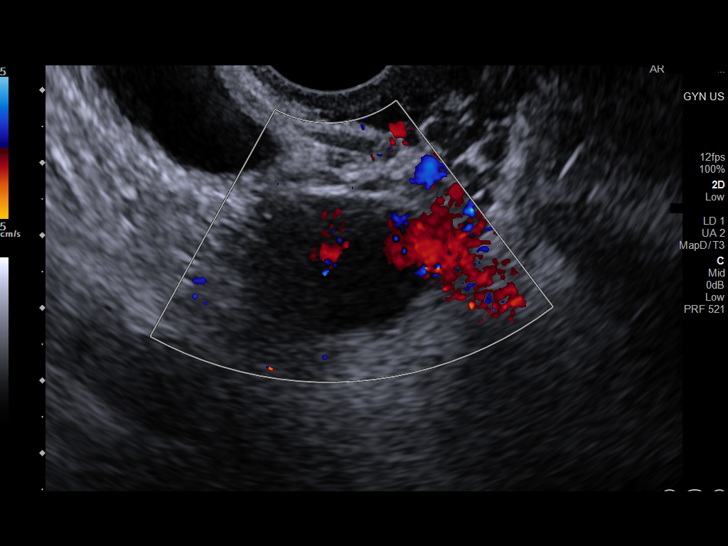
[im 75/75]
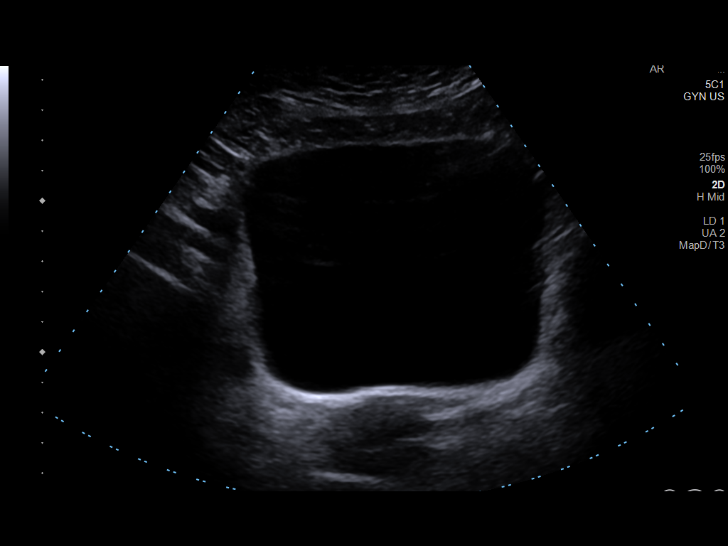

[14 of 25 positions shown; findings below may reference images not displayed]

FINDINGS: Uterus

Measurements: 7.3 x 3.4 x 4.3 cm = volume: 55 mL. No fibroids or
other mass visualized.

Endometrium

Thickness: 5 mm.  No focal abnormality visualized.

Right ovary

Measurements: 2.6 x 1.6 x 1.5 cm = volume: 3.2 mL. Normal
appearance/no adnexal mass.

Left ovary

Measurements: 2.9 x 2.2 x 2.5 cm = volume: 8.0 mL. Normal
appearance/no adnexal mass.

Other findings

No abnormal free fluid.
IMPRESSION: Negative pelvic ultrasound.
